# Patient Record
Sex: Male | Born: 1955 | Race: White | Hispanic: No | Marital: Married | Smoking: Never smoker
Health system: Southern US, Community
[De-identification: ages and names within clinical notes are randomized; demographics above are authoritative.]

## PROBLEM LIST (undated history)

## (undated) DIAGNOSIS — I8500 Esophageal varices without bleeding: Secondary | ICD-10-CM

## (undated) DIAGNOSIS — R161 Splenomegaly, not elsewhere classified: Secondary | ICD-10-CM

## (undated) DIAGNOSIS — N200 Calculus of kidney: Secondary | ICD-10-CM

## (undated) DIAGNOSIS — K7581 Nonalcoholic steatohepatitis (NASH): Secondary | ICD-10-CM

## (undated) HISTORY — PX: ELBOW SURGERY: SHX618

---

## 2003-05-05 ENCOUNTER — Other Ambulatory Visit: Payer: Self-pay

## 2004-01-03 ENCOUNTER — Ambulatory Visit: Payer: Self-pay | Admitting: Unknown Physician Specialty

## 2011-09-05 DIAGNOSIS — E1165 Type 2 diabetes mellitus with hyperglycemia: Secondary | ICD-10-CM | POA: Insufficient documentation

## 2011-09-05 DIAGNOSIS — R809 Proteinuria, unspecified: Secondary | ICD-10-CM | POA: Insufficient documentation

## 2011-10-11 DIAGNOSIS — L84 Corns and callosities: Secondary | ICD-10-CM | POA: Insufficient documentation

## 2011-10-31 DIAGNOSIS — E669 Obesity, unspecified: Secondary | ICD-10-CM | POA: Insufficient documentation

## 2011-11-05 DIAGNOSIS — K259 Gastric ulcer, unspecified as acute or chronic, without hemorrhage or perforation: Secondary | ICD-10-CM | POA: Insufficient documentation

## 2011-11-05 DIAGNOSIS — D696 Thrombocytopenia, unspecified: Secondary | ICD-10-CM | POA: Insufficient documentation

## 2011-11-05 DIAGNOSIS — K449 Diaphragmatic hernia without obstruction or gangrene: Secondary | ICD-10-CM | POA: Insufficient documentation

## 2011-11-05 DIAGNOSIS — K3189 Other diseases of stomach and duodenum: Secondary | ICD-10-CM | POA: Insufficient documentation

## 2011-11-05 DIAGNOSIS — M5126 Other intervertebral disc displacement, lumbar region: Secondary | ICD-10-CM | POA: Insufficient documentation

## 2011-11-27 DIAGNOSIS — N529 Male erectile dysfunction, unspecified: Secondary | ICD-10-CM | POA: Insufficient documentation

## 2012-05-19 DIAGNOSIS — K644 Residual hemorrhoidal skin tags: Secondary | ICD-10-CM | POA: Insufficient documentation

## 2012-06-11 DIAGNOSIS — K7460 Unspecified cirrhosis of liver: Secondary | ICD-10-CM | POA: Insufficient documentation

## 2013-09-11 DIAGNOSIS — E1159 Type 2 diabetes mellitus with other circulatory complications: Secondary | ICD-10-CM | POA: Insufficient documentation

## 2013-09-11 DIAGNOSIS — I152 Hypertension secondary to endocrine disorders: Secondary | ICD-10-CM | POA: Insufficient documentation

## 2014-06-30 DIAGNOSIS — M7501 Adhesive capsulitis of right shoulder: Secondary | ICD-10-CM | POA: Insufficient documentation

## 2014-11-16 DIAGNOSIS — M7918 Myalgia, other site: Secondary | ICD-10-CM | POA: Insufficient documentation

## 2015-04-22 DIAGNOSIS — M720 Palmar fascial fibromatosis [Dupuytren]: Secondary | ICD-10-CM | POA: Insufficient documentation

## 2015-06-16 DIAGNOSIS — G4700 Insomnia, unspecified: Secondary | ICD-10-CM | POA: Insufficient documentation

## 2015-08-19 DIAGNOSIS — M19011 Primary osteoarthritis, right shoulder: Secondary | ICD-10-CM | POA: Insufficient documentation

## 2016-03-22 DIAGNOSIS — G8929 Other chronic pain: Secondary | ICD-10-CM | POA: Insufficient documentation

## 2017-11-11 DIAGNOSIS — R161 Splenomegaly, not elsewhere classified: Secondary | ICD-10-CM | POA: Insufficient documentation

## 2020-05-24 DIAGNOSIS — M7712 Lateral epicondylitis, left elbow: Secondary | ICD-10-CM | POA: Insufficient documentation

## 2020-06-05 DIAGNOSIS — K7460 Unspecified cirrhosis of liver: Secondary | ICD-10-CM | POA: Diagnosis present

## 2020-06-05 DIAGNOSIS — K921 Melena: Secondary | ICD-10-CM | POA: Insufficient documentation

## 2020-07-26 ENCOUNTER — Encounter: Payer: Self-pay | Admitting: Emergency Medicine

## 2020-07-26 ENCOUNTER — Emergency Department
Admission: EM | Admit: 2020-07-26 | Discharge: 2020-07-27 | Disposition: A | Discharge status: Home / Self Care | Payer: 59 | Attending: Emergency Medicine | Admitting: Emergency Medicine

## 2020-07-26 ENCOUNTER — Other Ambulatory Visit: Payer: Self-pay

## 2020-07-26 DIAGNOSIS — N202 Calculus of kidney with calculus of ureter: Secondary | ICD-10-CM | POA: Insufficient documentation

## 2020-07-26 DIAGNOSIS — R109 Unspecified abdominal pain: Secondary | ICD-10-CM | POA: Diagnosis present

## 2020-07-26 DIAGNOSIS — N200 Calculus of kidney: Secondary | ICD-10-CM

## 2020-07-26 DIAGNOSIS — N201 Calculus of ureter: Secondary | ICD-10-CM

## 2020-07-26 HISTORY — DX: Esophageal varices without bleeding: I85.00

## 2020-07-26 HISTORY — DX: Nonalcoholic steatohepatitis (NASH): K75.81

## 2020-07-26 HISTORY — DX: Calculus of kidney: N20.0

## 2020-07-26 HISTORY — DX: Splenomegaly, not elsewhere classified: R16.1

## 2020-07-26 NOTE — ED Triage Notes (Signed)
Patient ambulatory to triage with steady gait, without difficulty or distress noted; pt reports left flank pain radiating into left lower abd acocmp by nausea since 8pm; st hx enlarged spleen and kidney stone

## 2020-07-27 ENCOUNTER — Encounter: Payer: Self-pay | Admitting: Emergency Medicine

## 2020-07-27 ENCOUNTER — Emergency Department: Payer: 59

## 2020-07-27 LAB — URINALYSIS, ROUTINE W REFLEX MICROSCOPIC
Bacteria, UA: NONE SEEN
Bilirubin Urine: NEGATIVE
Glucose, UA: >=500 mg/dL — AB
Ketones, ur: NEGATIVE mg/dL
Leukocytes,Ua: NEGATIVE
Nitrite: NEGATIVE
Protein, ur: NEGATIVE mg/dL
Specific Gravity, Urine: 1.029 (ref 1.005–1.030)
pH: 6 (ref 5.0–8.0)

## 2020-07-27 LAB — CBC WITH DIFFERENTIAL/PLATELET
Abs Immature Granulocytes: 0.01 10*3/uL (ref 0.00–0.07)
Basophils Absolute: 0 10*3/uL (ref 0.0–0.1)
Basophils Relative: 1 %
Eosinophils Absolute: 0.1 10*3/uL (ref 0.0–0.5)
Eosinophils Relative: 3 %
HCT: 33.2 % — ABNORMAL LOW (ref 39.0–52.0)
Hemoglobin: 10.3 g/dL — ABNORMAL LOW (ref 13.0–17.0)
Immature Granulocytes: 1 %
Lymphocytes Relative: 19 %
Lymphs Abs: 0.4 10*3/uL — ABNORMAL LOW (ref 0.7–4.0)
MCH: 25.5 pg — ABNORMAL LOW (ref 26.0–34.0)
MCHC: 31 g/dL (ref 30.0–36.0)
MCV: 82.2 fL (ref 80.0–100.0)
Monocytes Absolute: 0.3 10*3/uL (ref 0.1–1.0)
Monocytes Relative: 14 %
Neutro Abs: 1.2 10*3/uL — ABNORMAL LOW (ref 1.7–7.7)
Neutrophils Relative %: 62 %
Platelets: 34 10*3/uL — ABNORMAL LOW (ref 150–400)
RBC: 4.04 MIL/uL — ABNORMAL LOW (ref 4.22–5.81)
RDW: 16 % — ABNORMAL HIGH (ref 11.5–15.5)
WBC: 1.9 10*3/uL — ABNORMAL LOW (ref 4.0–10.5)
nRBC: 0 % (ref 0.0–0.2)

## 2020-07-27 LAB — COMPREHENSIVE METABOLIC PANEL
ALT: 30 U/L (ref 0–44)
AST: 42 U/L — ABNORMAL HIGH (ref 15–41)
Albumin: 3.8 g/dL (ref 3.5–5.0)
Alkaline Phosphatase: 75 U/L (ref 38–126)
Anion gap: 7 (ref 5–15)
BUN: 18 mg/dL (ref 8–23)
CO2: 21 mmol/L — ABNORMAL LOW (ref 22–32)
Calcium: 8.8 mg/dL — ABNORMAL LOW (ref 8.9–10.3)
Chloride: 107 mmol/L (ref 98–111)
Creatinine, Ser: 1.42 mg/dL — ABNORMAL HIGH (ref 0.61–1.24)
GFR, Estimated: 56 mL/min — ABNORMAL LOW (ref >60)
Glucose, Bld: 324 mg/dL — ABNORMAL HIGH (ref 70–99)
Potassium: 4.1 mmol/L (ref 3.5–5.1)
Sodium: 135 mmol/L (ref 135–145)
Total Bilirubin: 0.9 mg/dL (ref 0.3–1.2)
Total Protein: 6.7 g/dL (ref 6.5–8.1)

## 2020-07-27 LAB — LIPASE, BLOOD: Lipase: 44 U/L (ref 11–51)

## 2020-07-27 MED ORDER — TAMSULOSIN HCL 0.4 MG PO CAPS: ORAL_CAPSULE | ORAL | 0 refills | Status: DC

## 2020-07-27 MED ORDER — ONDANSETRON HCL 4 MG PO TABS: 4.0000 mg | ORAL_TABLET | Freq: Every day | ORAL | 1 refills | Status: AC | PRN

## 2020-07-27 MED ORDER — OXYCODONE-ACETAMINOPHEN 5-325 MG PO TABS: 2.0000 | ORAL_TABLET | Freq: Four times a day (QID) | ORAL | 0 refills | Status: DC | PRN

## 2020-07-27 MED ORDER — ONDANSETRON HCL 4 MG/2ML IJ SOLN
4.0000 mg | INTRAMUSCULAR | Status: AC
  Administered 2020-07-27: 4 mg via INTRAVENOUS
  Filled 2020-07-27: qty 2

## 2020-07-27 MED ORDER — OXYCODONE-ACETAMINOPHEN 5-325 MG PO TABS
2.0000 | ORAL_TABLET | Freq: Once | ORAL | Status: AC
  Administered 2020-07-27: 2 via ORAL
  Filled 2020-07-27: qty 2

## 2020-07-27 MED ORDER — KETOROLAC TROMETHAMINE 30 MG/ML IJ SOLN
15.0000 mg | Freq: Once | INTRAMUSCULAR | Status: AC
  Administered 2020-07-27: 15 mg via INTRAVENOUS
  Filled 2020-07-27: qty 1

## 2020-07-27 MED ORDER — DOCUSATE SODIUM 100 MG PO CAPS: ORAL_CAPSULE | ORAL | 0 refills | Status: DC

## 2020-07-27 MED ORDER — LACTATED RINGERS IV BOLUS
1000.0000 mL | Freq: Once | INTRAVENOUS | Status: AC
  Administered 2020-07-27: 1000 mL via INTRAVENOUS

## 2020-07-27 MED ORDER — MORPHINE SULFATE (PF) 4 MG/ML IV SOLN
4.0000 mg | Freq: Once | INTRAVENOUS | Status: AC
  Administered 2020-07-27: 4 mg via INTRAVENOUS
  Filled 2020-07-27: qty 1

## 2020-07-27 NOTE — ED Notes (Signed)
ED Provider at bedside. 

## 2020-07-27 NOTE — ED Notes (Addendum)
Pt presents with the sudden onset of L flank pain that radiates around to his lower back and into his groin around 8pm. Pt reports associated nausea with no vomiting. Pt reports severe 10/10 pain. Hx of kidney stones but states "this feels different".

## 2020-07-27 NOTE — ED Notes (Signed)
Pt back to room from CT

## 2020-07-27 NOTE — Discharge Instructions (Signed)
You have been seen in the Emergency Department (ED) today for pain caused by kidney stones.  As we have discussed, please drink plenty of fluids.  Please make a follow up appointment with the physician(s) listed elsewhere in this documentation.  You may take pain medication as needed but ONLY as prescribed.  Please also take your prescribed Flomax daily.    Please see your doctor as soon as possible as stones may take 1-3 weeks to pass and you may require additional care or medications.  Do not drink alcohol, drive or participate in any other potentially dangerous activities while taking opiate pain medication as it may make you sleepy. Do not take this medication with any other sedating medications, either prescription or over-the-counter. If you were prescribed Percocet or Vicodin, do not take these with acetaminophen (Tylenol) as it is already contained within these medications.   Take Percocet as needed for severe pain.  This medication is an opiate (or narcotic) pain medication and can be habit forming.  Use it as little as possible to achieve adequate pain control.  Do not use or use it with extreme caution if you have a history of opiate abuse or dependence.  If you are on a pain contract with your primary care doctor or a pain specialist, be sure to let them know you were prescribed this medication today from the [facility] Emergency Department.  This medication is intended for your use only - do not give any to anyone else and keep it in a secure place where nobody else, especially children, have access to it.  It will also cause or worsen constipation, so you may want to consider taking an over-the-counter stool softener while you are taking this medication.  Return to the Emergency Department (ED) or call your doctor if you have any worsening pain, fever, painful urination, are unable to urinate, or develop other symptoms that concern you.

## 2020-07-27 NOTE — ED Provider Notes (Signed)
[facility] Emergency Department Provider Note  ____________________________________________   Event Date/Time   First MD Initiated Contact with Patient 07/26/20 2358     (approximate)  I have reviewed the triage vital signs and the nursing notes.   HISTORY  Chief Complaint Flank Pain    HPI Eugene Cameron is a 64 y.o. male with medical history as listed below who presents for evaluation of cute onset and severe sharp pain in his left flank that radiates to the left side of his abdomen.  This occurred about 4 hours ago.  It has waxed and waned and almost went away but then came back even more severe just recently.  He has had nausea and several dry heaves but no significant vomiting.  Nothing particular makes his symptoms better or worse.  He denies fever, sore throat, chest pain, shortness of breath.  He was feeling fine before the acute onset.  He has had no hematuria nor dysuria.         Past Medical History:  Diagnosis Date  . Esophageal varices (HCC)    s/p multiple bandings  . Kidney stone   . NASH (nonalcoholic steatohepatitis)   . Spleen enlarged     Patient Active Problem List   Diagnosis Date Noted  . Kidney stone     Past Surgical History:  Procedure Laterality Date  . ELBOW SURGERY Left     Prior to Admission medications   Medication Sig Start Date End Date Taking? Authorizing Provider  docusate sodium (COLACE) 100 MG capsule Take 1 tablet once or twice daily as needed for constipation while taking narcotic pain medicine 07/27/20  Yes Loleta Rose, MD  ondansetron (ZOFRAN) 4 MG tablet Take 1 tablet (4 mg total) by mouth daily as needed for nausea or vomiting. 07/27/20 07/27/21 Yes Loleta Rose, MD  oxyCODONE-acetaminophen (PERCOCET) 5-325 MG tablet Take 2 tablets by mouth every 6 (six) hours as needed for severe pain. 07/27/20  Yes Loleta Rose, MD  tamsulosin (FLOMAX) 0.4 MG CAPS capsule Take 1 tablet by mouth daily until you  pass the kidney stone or no longer have symptoms 07/27/20  Yes Loleta Rose, MD    Allergies Patient has no known allergies.  History reviewed. No pertinent family history.  Social History Social History   Tobacco Use  . Smoking status: Never Smoker  . Smokeless tobacco: Never Used    Review of Systems Constitutional: No fever/chills Eyes: No visual changes. ENT: No sore throat. Cardiovascular: Denies chest pain. Respiratory: Denies shortness of breath. Gastrointestinal: Positive for left-sided flank pain that radiates into the left side of his abdomen associated with nausea and some degree of vomiting. Genitourinary: Negative for dysuria. Musculoskeletal: Left flank pain, otherwise negative for back pain or musculoskeletal issues. Integumentary: Negative for rash. Neurological: Negative for headaches, focal weakness or numbness.   ____________________________________________   PHYSICAL EXAM:  VITAL SIGNS: ED Triage Vitals  Enc Vitals Group     BP 07/26/20 2348 (!) 152/81     Pulse Rate 07/26/20 2348 61     Resp 07/26/20 2348 16     Temp 07/26/20 2348 97.9 F (36.6 C)     Temp Source 07/26/20 2348 Oral     SpO2 07/26/20 2348 100 %     Weight 07/26/20 2343 104.3 kg (230 lb)     Height 07/26/20 2343 1.829 m (6')     Head Circumference --      Peak Flow --  Pain Score 07/26/20 2343 9     Pain Loc --      Pain Edu? --      Excl. in GC? --     Constitutional: Alert and oriented.  Patient is in pain but conversant. Eyes: Conjunctivae are normal.  Head: Atraumatic. Nose: No congestion/rhinnorhea. Mouth/Throat: Patient is wearing a mask. Neck: No stridor.  No meningeal signs.   Cardiovascular: Normal rate, regular rhythm. Good peripheral circulation. Respiratory: Normal respiratory effort.  No retractions. Gastrointestinal: Soft and nondistended.  Some guarding and mild tenderness to palpation of the left side of his abdomen. Musculoskeletal: Tenderness to  percussion of the left flank. Neurologic:  Normal speech and language. No gross focal neurologic deficits are appreciated.  Skin:  Skin is warm, dry and intact. Psychiatric: Mood and affect are normal. Speech and behavior are normal.  ____________________________________________   LABS (all labs ordered are listed, but only abnormal results are displayed)  Labs Reviewed  CBC WITH DIFFERENTIAL/PLATELET - Abnormal; Notable for the following components:      Result Value   WBC 1.9 (*)    RBC 4.04 (*)    Hemoglobin 10.3 (*)    HCT 33.2 (*)    MCH 25.5 (*)    RDW 16.0 (*)    Platelets 34 (*)    Neutro Abs 1.2 (*)    Lymphs Abs 0.4 (*)    All other components within normal limits  COMPREHENSIVE METABOLIC PANEL - Abnormal; Notable for the following components:   CO2 21 (*)    Glucose, Bld 324 (*)    Creatinine, Ser 1.42 (*)    Calcium 8.8 (*)    AST 42 (*)    GFR, Estimated 56 (*)    All other components within normal limits  URINALYSIS, ROUTINE W REFLEX MICROSCOPIC - Abnormal; Notable for the following components:   Color, Urine YELLOW (*)    APPearance CLEAR (*)    Glucose, UA >=500 (*)    Hgb urine dipstick MODERATE (*)    All other components within normal limits  LIPASE, BLOOD   ____________________________________________  EKG  No indication for emergent EKG ____________________________________________  RADIOLOGY I, Loleta Roseory Dontea Corlew, personally viewed and evaluated these images (plain radiographs) as part of my medical decision making, as well as reviewing the written report by the radiologist.  ED MD interpretation: 3 x 3 x 5 mm left UPJ stone with some hydronephrosis.  There is also some peripancreatic stranding but it does not correlate clinically with any acute tenderness.  Official radiology report(s): CT Renal Stone Study  Result Date: 07/27/2020 CLINICAL DATA:  Left flank pain, lower abdominal pain, nausea EXAM: CT ABDOMEN AND PELVIS WITHOUT CONTRAST TECHNIQUE:  Multidetector CT imaging of the abdomen and pelvis was performed following the standard protocol without IV contrast. COMPARISON:  None. FINDINGS: Lower chest: Mild bibasilar atelectasis. Hypoattenuation of the cardiac blood pool is in keeping with at least mild anemia. Small hiatal hernia. Gastroesophageal varices noted. Hepatobiliary: The liver is atrophic and demonstrates a nodular contour in keeping with changes of advanced cirrhosis. There is no definite focal intrahepatic mass identified on this noncontrast examination. No intra or extrahepatic biliary ductal dilation. Cholecystectomy has been performed. Pancreas: There is mild peripancreatic soft tissue infiltration which may reflect retroperitoneal edema given the extensive retroperitoneal portosystemic collateralization or superimposed acute inflammatory changes in the setting of acute pancreatitis. The pancreatic duct is not dilated. No pancreatic parenchymal calcifications are seen. No peripancreatic fluid collections are identified. Spleen: The spleen is  markedly enlarged measuring 22 cm in greatest dimension. No intrasplenic lesion is identified on this noncontrast examination. Adrenals/Urinary Tract: The adrenal glands are unremarkable. The kidneys are normal in size and position. There is mild left hydronephrosis secondary to an obstructing 3 mm x 3 mm x 5 mm calculus within the a left ureteropelvic junction. Nonobstructing 4 mm calculus is seen within the lower pole of the right kidney. No additional ureteral calculi. No hydronephrosis on the right. The bladder is unremarkable. Stomach/Bowel: Stomach, small bowel, and large bowel are unremarkable. Appendix normal. Mild perihepatic ascites. No free intraperitoneal gas. Vascular/Lymphatic: Numerous gastroesophageal varices are identified and there is enlargement of the a splenic, superior mesenteric, and portal veins in keeping with portal venous hypertension. The abdominal vasculature is otherwise  unremarkable on this noncontrast examination. Reproductive: Mild central prostatic enlargement. Seminal vesicles are unremarkable. Other: No abdominal wall hernia.  Rectum unremarkable. Musculoskeletal: No acute bone abnormality. No lytic or blastic bone lesion identified. IMPRESSION: Obstructing 3 x 3 x 5 mm calculus within the left ureteropelvic junction resulting in mild left hydronephrosis. Mild nonobstructing right nephrolithiasis. Changes of advanced cirrhosis and portal venous hypertension with engorgement of the portal venous system, marked splenomegaly, and extensive gastroesophageal varices. Peripancreatic edema possibly related to retroperitoneal portosystemic collateralization or superimposed acute inflammatory changes as can be seen with acute pancreatitis. Correlation with laboratory examination may be helpful for further evaluation. Electronically Signed   By: Helyn Numbers MD   On: 07/27/2020 02:13    ____________________________________________   PROCEDURES   Procedure(s) performed (including Critical Care):  Procedures   ____________________________________________   INITIAL IMPRESSION / MDM / ASSESSMENT AND PLAN / ED COURSE  As part of my medical decision making, I reviewed the following data within the electronic MEDICAL RECORD NUMBER History obtained from family, Nursing notes reviewed and incorporated, Labs reviewed , Old chart reviewed, Notes from prior ED visits and [place] Controlled Substance Database   Differential diagnosis includes, but is not limited to, renal/ureteral colic, diverticulitis, musculoskeletal strain, UTI/pyelonephritis.  Patient is hemodynamically stable.  Plan is for CT renal stone protocol and standard lab work including urinalysis.  Ordering morphine 4 mg IV, Toradol 15 mg IV, and Zofran 4 mg IV.  Patient and his wife understand and agree with the plan.     Clinical Course as of 07/27/20 0255  Wed Jul 27, 2020  0106 CBC with  Differential/Platelet(!) Patient with pancytopenia.  However I reviewed the medical record from [facility] and his CBC is stable compared to before, with an even slightly greater hemoglobin.  However his creatinine is a little bit elevated from baseline which seems to be around 1.1 or 1.2.  I ordered lactated Ringer's 1 L IV bolus. [CF]  0148 Urinalysis, Routine w reflex microscopic Urine, Clean Catch(!) Hematuria without evidence of infection [CF]  0251 Patient has 3 x 3 x 5 mm left UPJ stone.  I reassessed him and he said he has no pain at all at this time.  He will follow-up as needed with urology but I explained that I think it is unlikely he will be a candidate for lithotripsy given his chronic thrombocytopenia.  He was pleased at his lab results as they are better than I have been in the past and he has doctors with whom he can follow-up.  He said that he has been told that is okay if he uses Tylenol if needed so I am writing a prescription for Percocet, Flomax, and Zofran.  I gave my  usual and customary management recommendations and return precautions. [CF]    Clinical Course User Index [CF] Loleta Rose, MD     ____________________________________________  FINAL CLINICAL IMPRESSION(S) / ED DIAGNOSES  Final diagnoses:  Left ureteral stone  Kidney stone     MEDICATIONS GIVEN DURING THIS VISIT:  Medications  oxyCODONE-acetaminophen (PERCOCET/ROXICET) 5-325 MG per tablet 2 tablet (has no administration in time range)  morphine 4 MG/ML injection 4 mg (4 mg Intravenous Given 07/27/20 0041)  ondansetron (ZOFRAN) injection 4 mg (4 mg Intravenous Given 07/27/20 0040)  ketorolac (TORADOL) 30 MG/ML injection 15 mg (15 mg Intravenous Given 07/27/20 0040)  lactated ringers bolus 1,000 mL (1,000 mLs Intravenous New Bag/Given 07/27/20 0117)     ED Discharge Orders         Ordered    oxyCODONE-acetaminophen (PERCOCET) 5-325 MG tablet  Every 6 hours PRN        07/27/20 0254    ondansetron (ZOFRAN)  4 MG tablet  Daily PRN        07/27/20 0254    tamsulosin (FLOMAX) 0.4 MG CAPS capsule        07/27/20 0254    docusate sodium (COLACE) 100 MG capsule        07/27/20 0254          *Please note:  Eugene Cameron was evaluated in Emergency Department on 07/27/2020 for the symptoms described in the history of present illness. He was evaluated in the context of the global COVID-19 pandemic, which necessitated consideration that the patient might be at risk for infection with the SARS-CoV-2 virus that causes COVID-19. Institutional protocols and algorithms that pertain to the evaluation of patients at risk for COVID-19 are in a state of rapid change based on information released by regulatory bodies including the CDC and federal and state organizations. These policies and algorithms were followed during the patient's care in the ED.  Some ED evaluations and interventions may be delayed as a result of limited staffing during and after the pandemic.*  Note:  This document was prepared using Dragon voice recognition software and may include unintentional dictation errors.   Loleta Rose, MD 07/27/20 [phone]

## 2020-08-02 ENCOUNTER — Other Ambulatory Visit
Admission: RE | Admit: 2020-08-02 | Discharge: 2020-08-02 | Disposition: A | Discharge status: Home / Self Care | Payer: 59 | Source: Home / Self Care | Attending: Urology | Admitting: Urology

## 2020-08-02 ENCOUNTER — Ambulatory Visit
Admission: RE | Admit: 2020-08-02 | Discharge: 2020-08-02 | Disposition: A | Discharge status: Home / Self Care | Payer: 59 | Attending: Urology | Admitting: Urology

## 2020-08-02 ENCOUNTER — Other Ambulatory Visit: Payer: Self-pay | Admitting: *Deleted

## 2020-08-02 ENCOUNTER — Ambulatory Visit (INDEPENDENT_AMBULATORY_CARE_PROVIDER_SITE_OTHER): Payer: 59 | Admitting: Urology

## 2020-08-02 ENCOUNTER — Ambulatory Visit
Admission: RE | Admit: 2020-08-02 | Discharge: 2020-08-02 | Disposition: A | Discharge status: Home / Self Care | Payer: 59 | Source: Ambulatory Visit | Attending: Urology | Admitting: Urology

## 2020-08-02 ENCOUNTER — Encounter: Payer: Self-pay | Admitting: Urology

## 2020-08-02 ENCOUNTER — Other Ambulatory Visit: Payer: Self-pay

## 2020-08-02 VITALS — BP 114/68 | HR 65 | Ht 72.0 in | Wt 227.0 lb

## 2020-08-02 DIAGNOSIS — R1012 Left upper quadrant pain: Secondary | ICD-10-CM

## 2020-08-02 DIAGNOSIS — N200 Calculus of kidney: Secondary | ICD-10-CM

## 2020-08-02 DIAGNOSIS — N201 Calculus of ureter: Secondary | ICD-10-CM | POA: Diagnosis not present

## 2020-08-02 LAB — URINALYSIS, COMPLETE (UACMP) WITH MICROSCOPIC
Bacteria, UA: NONE SEEN
Bilirubin Urine: NEGATIVE
Glucose, UA: 500 mg/dL — AB
Hgb urine dipstick: NEGATIVE
Ketones, ur: NEGATIVE mg/dL
Leukocytes,Ua: NEGATIVE
Nitrite: NEGATIVE
Protein, ur: NEGATIVE mg/dL
Specific Gravity, Urine: 1.01 (ref 1.005–1.030)
Squamous Epithelial / HPF: NONE SEEN (ref 0–5)
pH: 5.5 (ref 5.0–8.0)

## 2020-08-02 NOTE — Progress Notes (Signed)
   08/02/20 10:54 AM   Tereasa Coop November 25, 1955 [id number]  CC: Left UPJ stone  HPI: I saw Mr. Eugene Cameron and his wife in clinic for the above issues.  He is a 65 year old male who presented to the ED on 07/27/2020 with severe left-sided flank pain.  CT showed a 5 mm left UPJ stone with mild hydronephrosis, and a 5 mm right lower pole nonobstructing stone.  He was discharged with medical expulsive therapy.  He reports has been feeling pretty well over the last few days without any flank pain.  Urinalysis today is completely benign.  On KUB today, I do not appreciate any obvious stone on the left side, and right lower pole stone is clearly seen.   PMH: Past Medical History:  Diagnosis Date  . Esophageal varices (HCC)    s/p multiple bandings  . Kidney stone   . NASH (nonalcoholic steatohepatitis)   . Spleen enlarged     Surgical History: Past Surgical History:  Procedure Laterality Date  . ELBOW SURGERY Left     Social History:  reports that he has never smoked. He has never used smokeless tobacco. No history on file for alcohol use and drug use.  Physical Exam: BP 114/68   Pulse 65   Ht 6' (1.829 m)   Wt 227 lb (103 kg)   BMI 30.79 kg/m    Constitutional:  Alert and oriented, No acute distress. Cardiovascular: No clubbing, cyanosis, or edema. Respiratory: Normal respiratory effort, no increased work of breathing. GI: Abdomen is soft, nontender, nondistended, no abdominal masses  Laboratory Data: Reviewed, see HPI  Pertinent Imaging: I have personally viewed and interpreted the CT from 07/27/2020 showing a 5 mm left UPJ stone, and KUB today with no obvious calcification  Assessment & Plan:   65 year old male with a 5 mm left UPJ stone diagnosed on CT on 07/27/2020.  He has had complete resolution of his pain over the last few days, and urinalysis today is benign.  On his KUB today, I do not appreciate any obvious calcification.  He does a lot of traveling, and is very  worried that the stone could still be present even though his pain is better, and with his upcoming traveling he would like to get confirmation with a CT.  We discussed the risks and benefits including cost, radiation, and he would like to pursue CT today.  -CT today to confirm stone passage, call with results -Continue yearly follow-up for right lower pole stone surveillance, consider shockwave lithotripsy in the future after this acute stone episode if patient desires  Legrand Rams, MD 08/02/2020  [facility] [address], [address] [place], [place] [address] [phone]

## 2020-08-02 NOTE — Patient Instructions (Addendum)
Textbook of Natural Medicine (5th ed., pp. 1518-1527.e3). [place], [place]: Elsevier.">  Dietary Guidelines to Help Prevent Kidney Stones Kidney stones are deposits of minerals and salts that form inside your kidneys. Your risk of developing kidney stones may be greater depending on your diet, your lifestyle, the medicines you take, and whether you have certain medical conditions. Most people can lower their chances of developing kidney stones by following the instructions below. Your dietitian may give you more specific instructions depending on your overall health and the type of kidney stones you tend to develop. What are tips for following this plan? Reading food labels  Choose foods with "no salt added" or "low-salt" labels. Limit your salt (sodium) intake to less than 1,500 mg a day.  Choose foods with calcium for each meal and snack. Try to eat about 300 mg of calcium at each meal. Foods that contain 200-500 mg of calcium a serving include: ? 8 oz (237 mL) of milk, calcium-fortifiednon-dairy milk, and calcium-fortifiedfruit juice. Calcium-fortified means that calcium has been added to these drinks. ? 8 oz (237 mL) of kefir, yogurt, and soy yogurt. ? 4 oz (114 g) of tofu. ? 1 oz (28 g) of cheese. ? 1 cup (150 g) of dried figs. ? 1 cup (91 g) of cooked broccoli. ? One 3 oz (85 g) can of sardines or mackerel. Most people need 1,000-1,500 mg of calcium a day. Talk to your dietitian about how much calcium is recommended for you.   Shopping  Buy plenty of fresh fruits and vegetables. Most people do not need to avoid fruits and vegetables, even if these foods contain nutrients that may contribute to kidney stones.  When shopping for convenience foods, choose: ? Whole pieces of fruit. ? Pre-made salads with dressing on the side. ? Low-fat fruit and yogurt smoothies.  Avoid buying frozen meals or prepared deli foods. These can be high in sodium.  Look for foods with live cultures, such as  yogurt and kefir.  Choose high-fiber grains, such as whole-wheat breads, oat bran, and wheat cereals. Cooking  Do not add salt to food when cooking. Place a salt shaker on the table and allow each person to add his or her own salt to taste.  Use vegetable protein, such as beans, textured vegetable protein (TVP), or tofu, instead of meat in pasta, casseroles, and soups. Meal planning  Eat less salt, if told by your dietitian. To do this: ? Avoid eating processed or pre-made food. ? Avoid eating fast food.  Eat less animal protein, including cheese, meat, poultry, or fish, if told by your dietitian. To do this: ? Limit the number of times you have meat, poultry, fish, or cheese each week. Eat a diet free of meat at least 2 days a week. ? Eat only one serving each day of meat, poultry, fish, or seafood. ? When you prepare animal protein, cut pieces into small portion sizes. For most meat and fish, one serving is about the size of the palm of your hand.  Eat at least five servings of fresh fruits and vegetables each day. To do this: ? Keep fruits and vegetables on hand for snacks. ? Eat one piece of fruit or a handful of berries with breakfast. ? Have a salad and fruit at lunch. ? Have two kinds of vegetables at dinner.  Limit foods that are high in a substance called oxalate. These include: ? Spinach (cooked), rhubarb, beets, sweet potatoes, and Swiss chard. ? Peanuts. ?   Potato chips, french fries, and baked potatoes with skin on. ? Nuts and nut products. ? Chocolate.  If you regularly take a diuretic medicine, make sure to eat at least 1 or 2 servings of fruits or vegetables that are high in potassium each day. These include: ? Avocado. ? Banana. ? Orange, prune, carrot, or tomato juice. ? Baked potato. ? Cabbage. ? Beans and split peas. Lifestyle  Drink enough fluid to keep your urine pale yellow. This is the most important thing you can do. Spread your fluid intake throughout  the day.  If you drink alcohol: ? Limit how much you use to:  0-1 drink a day for women who are not pregnant.  0-2 drinks a day for men. ? Be aware of how much alcohol is in your drink. In the [place] one drink equals one 12 oz bottle of beer (355 mL), one 5 oz glass of wine (148 mL), or one 1 oz glass of hard liquor (44 mL).  Lose weight if told by your health care provider. Work with your dietitian to find an eating plan and weight loss strategies that work best for you.   General information  Talk to your health care provider and dietitian about taking daily supplements. You may be told the following depending on your health and the cause of your kidney stones: ? Not to take supplements with vitamin C. ? To take a calcium supplement. ? To take a daily probiotic supplement. ? To take other supplements such as magnesium, fish oil, or vitamin B6.  Take over-the-counter and prescription medicines only as told by your health care provider. These include supplements. What foods should I limit? Limit your intake of the following foods, or eat them as told by your dietitian. Vegetables Spinach. Rhubarb. Beets. Canned vegetables. Rosita Fire. Olives. Baked potatoes with skin. Grains Wheat bran. Baked goods. Salted crackers. Cereals high in sugar. Meats and other proteins Nuts. Nut butters. Large portions of meat, poultry, or fish. Salted, precooked, or cured meats, such as sausages, meat loaves, and hot dogs. Dairy Cheese. Beverages Regular soft drinks. Regular vegetable juice. Seasonings and condiments Seasoning blends with salt. Salad dressings. Soy sauce. Ketchup. Barbecue sauce. Other foods Canned soups. Canned pasta sauce. Casseroles. Pizza. Lasagna. Frozen meals. Potato chips. [place] fries. The items listed above may not be a complete list of foods and beverages you should limit. Contact a dietitian for more information. What foods should I avoid? Talk to your dietitian about  specific foods you should avoid based on the type of kidney stones you have and your overall health. Fruits Grapefruit. The item listed above may not be a complete list of foods and beverages you should avoid. Contact a dietitian for more information. Summary  Kidney stones are deposits of minerals and salts that form inside your kidneys.  You can lower your risk of kidney stones by making changes to your diet.  The most important thing you can do is drink enough fluid. Drink enough fluid to keep your urine pale yellow.  Talk to your dietitian about how much calcium you should have each day, and eat less salt and animal protein as told by your dietitian. This information is not intended to replace advice given to you by your health care provider. Make sure you discuss any questions you have with your health care provider. Document Revised: 03/12/2019 Document Reviewed: 03/12/2019 Elsevier Patient Education  2021 [employer] Inc.   Laser Therapy for Kidney Stones, Care After This sheet gives  you information about how to care for yourself after your procedure. Your health care provider may also give you more specific instructions. If you have problems or questions, contact your health care provider. What can I expect after the procedure? After the procedure, it is common to have:  Pain.  A burning sensation while urinating.  Small amounts of blood in your urine.  A need to urinate frequently.  Pieces of kidney stone in your urine.  Mild discomfort when urinating that may be felt in the back. You may experience this if you have a flexible tube (stent) in your ureter. Follow these instructions at home: Medicines  Take over-the-counter and prescription medicines only as told by your health care provider.  If you were prescribed an antibiotic medicine, take it as told by your health care provider. Do not stop taking the antibiotic even if you start to feel better.  Ask your health  care provider if the medicine prescribed to you: ? Requires you to avoid driving or using heavy machinery. ? Can cause constipation. You may need to take actions to prevent or treat constipation, such as:  Take over-the-counter or prescription medicines.  Eat foods that are high in fiber, such as beans, whole grains, and fresh fruits and vegetables.  Limit foods that are high in fat and processed sugars, such as fried or sweet foods. Activity  Return to your normal activities as told by your health care provider. Ask your health care provider what activities are safe for you.  Do not drive for 24 hours if you were given a sedative during your procedure. General instructions  If your health care provider approves, you may take a warm bath to ease discomfort and burning.  Drink enough fluid to keep your urine pale yellow. Your health care provider may recommend drinking two 8 oz (237 mL) glasses of water per hour for a few hours after your procedure.  You may be asked to strain your urine to collect any stone fragments that you pass. These fragments may be tested.  Keep all follow-up visits as told by your health care provider. This is important. If you have a stent, you will need to return to your health care provider to have the stent removed.   Contact a health care provider if you:  Have pain or a burning feeling that lasts more than 2 days.  Feel nauseous.  Vomit more and more often.  Have difficulty urinating.  Have pain that gets worse or does not get better with medicine. Get help right away if:  You are unable to urinate, even if your bladder feels full.  You have: ? Bright red blood or blood clots in your urine. ? More blood in your urine. ? Severe pain or discomfort. ? A fever or shaking chills. ? Abdominal pain. ? Difficulty breathing. ? Swelling in your legs. Summary  After the procedure, it is common to have a burning sensation while urinating and small  amounts of blood in your urine.  Take over-the-counter and prescription medicines only as told by your health care provider.  Drink enough fluid to keep your urine pale yellow.  Keep all follow-up visits as told by your health care provider. This is important. This information is not intended to replace advice given to you by your health care provider. Make sure you discuss any questions you have with your health care provider. Document Revised: 11/28/2017 Document Reviewed: 11/28/2017 Elsevier Patient Education  2021 [employer].  [employer] [facility] (25th ed., pp. [phone]). [place]: [employer], [employer]. Retrieved from https://www.clinicalkey.com/#!/content/[email or web address]">  Lithotripsy  Lithotripsy is a treatment that can help break up kidney stones that are too large to pass on their own. This is a nonsurgical procedure that crushes a kidney stone with shock waves. These shock waves pass through your body and focus on the kidney stone. They cause the kidney stone to break up into smaller pieces while it is still in the urinary tract. The smaller pieces of stone can pass more easily out of your body in the urine. Tell a health care provider about:  Any allergies you have.  All medicines you are taking, including vitamins, herbs, eye drops, creams, and over-the-counter medicines.  Any problems you or family members have had with anesthetic medicines.  Any blood disorders you have.  Any surgeries you have had.  Any medical conditions you have.  Whether you are pregnant or may be pregnant. What are the risks? Generally, this is a safe procedure. However, problems may occur, including:  Infection.  Bleeding from the kidney.  Bruising of the kidney or skin.  Scarring of the kidney, which can lead to: ? Increased blood pressure. ? Poor kidney function. ? Return (recurrence) of kidney stones.  Damage to other structures or  organs, such as the liver, colon, spleen, or pancreas.  Blockage (obstruction) of the tube that carries urine from the kidney to the bladder (ureter).  Failure of the kidney stone to break into pieces (fragments). What happens before the procedure? Staying hydrated Follow instructions from your health care provider about hydration, which may include:  Up to 2 hours before the procedure - you may continue to drink clear liquids, such as water, clear fruit juice, black coffee, and plain tea. Eating and drinking restrictions Follow instructions from your health care provider about eating and drinking, which may include:  8 hours before the procedure - stop eating heavy meals or foods, such as meat, fried foods, or fatty foods.  6 hours before the procedure - stop eating light meals or foods, such as toast or cereal.  6 hours before the procedure - stop drinking milk or drinks that contain milk.  2 hours before the procedure - stop drinking clear liquids. Medicines Ask your health care provider about:  Changing or stopping your regular medicines. This is especially important if you are taking diabetes medicines or blood thinners.  Taking medicines such as aspirin and ibuprofen. These medicines can thin your blood. Do not take these medicines unless your health care provider tells you to take them.  Taking over-the-counter medicines, vitamins, herbs, and supplements. Tests You may have tests, such as:  Blood tests.  Urine tests.  Imaging tests, such as a CT scan. General instructions  Plan to have someone take you home from the hospital or clinic.  If you will be going home right after the procedure, plan to have someone with you for 24 hours.  Ask your health care provider what steps will be taken to help prevent infection. These may include washing skin with a germ-killing soap. What happens during the procedure?  An IV will be inserted into one of your veins.  You will be  given one or more of the following: ? A medicine to help you relax (sedative). ? A medicine to make you fall asleep (general anesthetic).  A water-filled cushion may be placed behind your kidney or on your abdomen. In some cases, you may be placed in a tub  of lukewarm water.  Your body will be positioned in a way that makes it easy to target the kidney stone.  An X-ray or ultrasound exam will be done to locate your stone.  Shock waves will be aimed at the stone. If you are awake, you may feel a tapping sensation as the shock waves pass through your body.  A flexible tube with holes in it (stent) may be placed in the ureter. This will help keep urine flowing from the kidney if the fragments of the stone have been blocking the ureter. The procedure may vary among health care providers and hospitals.   What happens after the procedure?  You may have an X-ray to see whether the procedure was able to break up the kidney stone and how much of the stone has passed. If large stone fragments remain after treatment, you may need to have a second procedure at a later time.  Your blood pressure, heart rate, breathing rate, and blood oxygen level will be monitored until you leave the hospital or clinic.  You may be given antibiotics or pain medicine as needed.  If a stent was placed in your ureter during surgery, it may stay in place for a few weeks.  You may need to strain your urine to collect pieces of the kidney stone for testing.  You will need to drink plenty of water.  If you were given a sedative during the procedure, it can affect you for several hours. Do not drive or operate machinery until your health care provider says that it is safe. Summary  Lithotripsy is a treatment that can help break up kidney stones that are too large to pass on their own.  Lithotripsy is a nonsurgical procedure that crushes a kidney stone with shock waves.  Generally, this is a safe procedure. However,  problems may occur, including damage to the kidney or other organs, infection, or obstruction of the tube that carries urine from the kidney to the bladder (ureter).  You may have a stent placed in your ureter to help drain your urine. This stent may stay in place for a few weeks.  After the procedure, you will need to drink plenty of water. You may be asked to strain your urine to collect pieces of the kidney stone for testing. This information is not intended to replace advice given to you by your health care provider. Make sure you discuss any questions you have with your health care provider. Document Revised: 12/31/2018 Document Reviewed: 12/31/2018 Elsevier Patient Education  2021 [employer].

## 2020-08-03 ENCOUNTER — Telehealth: Payer: Self-pay

## 2020-08-03 NOTE — Telephone Encounter (Signed)
-----   Message from Sondra Come, MD sent at 08/03/2020  8:16 AM EDT ----- Good news, left ureteral stone has passed and is gone.  He has the small right lower pole stone that is not causing blockage.  I would recommend follow-up in 6 to 12 months with a KUB to evaluate for any change in size, but he can follow-up sooner if he wants to consider shockwave lithotripsy of that stone  Legrand Rams, MD 08/03/2020

## 2020-08-03 NOTE — Telephone Encounter (Signed)
See my chart message

## 2021-03-17 ENCOUNTER — Other Ambulatory Visit: Payer: Self-pay | Admitting: *Deleted

## 2021-03-17 ENCOUNTER — Ambulatory Visit
Admission: RE | Admit: 2021-03-17 | Discharge: 2021-03-17 | Disposition: A | Discharge status: Home / Self Care | Payer: 59 | Source: Ambulatory Visit | Attending: Physician Assistant | Admitting: Physician Assistant

## 2021-03-17 ENCOUNTER — Ambulatory Visit (INDEPENDENT_AMBULATORY_CARE_PROVIDER_SITE_OTHER): Payer: 59 | Admitting: Physician Assistant

## 2021-03-17 ENCOUNTER — Ambulatory Visit
Admission: RE | Admit: 2021-03-17 | Discharge: 2021-03-17 | Disposition: A | Discharge status: Home / Self Care | Payer: 59 | Attending: Physician Assistant | Admitting: Physician Assistant

## 2021-03-17 ENCOUNTER — Other Ambulatory Visit: Payer: Self-pay

## 2021-03-17 ENCOUNTER — Encounter: Payer: Self-pay | Admitting: Physician Assistant

## 2021-03-17 VITALS — BP 125/74 | HR 65 | Ht 72.0 in | Wt 220.0 lb

## 2021-03-17 DIAGNOSIS — N200 Calculus of kidney: Secondary | ICD-10-CM | POA: Diagnosis not present

## 2021-03-17 DIAGNOSIS — N201 Calculus of ureter: Secondary | ICD-10-CM | POA: Diagnosis not present

## 2021-03-17 LAB — URINALYSIS, COMPLETE
Bilirubin, UA: NEGATIVE
Ketones, UA: NEGATIVE
Leukocytes,UA: NEGATIVE
Nitrite, UA: NEGATIVE
Protein,UA: NEGATIVE
Specific Gravity, UA: <1.005 — ABNORMAL LOW (ref 1.005–1.030)
Urobilinogen, Ur: 0.2 mg/dL (ref 0.2–1.0)
pH, UA: 5 (ref 5.0–7.5)

## 2021-03-17 LAB — MICROSCOPIC EXAMINATION
Bacteria, UA: NONE SEEN
RBC, Urine: >30 /hpf — ABNORMAL HIGH (ref 0–2)

## 2021-03-17 MED ORDER — OXYCODONE HCL 5 MG PO TABS: 5.0000 mg | ORAL_TABLET | Freq: Four times a day (QID) | ORAL | 0 refills | Status: DC | PRN

## 2021-03-17 MED ORDER — ONDANSETRON 4 MG PO TBDP: 4.0000 mg | ORAL_TABLET | Freq: Three times a day (TID) | ORAL | 1 refills | Status: AC | PRN

## 2021-03-17 MED ORDER — TAMSULOSIN HCL 0.4 MG PO CAPS: ORAL_CAPSULE | ORAL | 0 refills | Status: AC

## 2021-03-17 MED ORDER — TAMSULOSIN HCL 0.4 MG PO CAPS: ORAL_CAPSULE | ORAL | 0 refills | Status: DC

## 2021-03-17 NOTE — Patient Instructions (Addendum)
For the next 4 weeks, please do the following: -Take Flomax 0.4mg  daily as prescribed today -Stay well hydrated -Strain your urine to catch any stones that pass -Treat any pain with Tylenol or oxycodone as prescribed today. You may take these medications together, but please be mindful of your maximum recommended daily Tylenol dose with your history of fatty liver. -Treat any nausea with Zofran as prescribed today  I will plan to see you back in clinic in 4 weeks with another x-ray prior to see if you have passed a stone.  Please call our office immediately (we are open 8a-5p Monday-Friday) or go to the Emergency Department if you develop any of the following: -Fever -Chills -Nausea and/or vomiting uncontrollable with Zofran -Pain uncontrollable with oxycodone

## 2021-03-17 NOTE — Progress Notes (Signed)
03/17/2021 12:20 PM   Eugene Cameron 06-Jul-1955 [id number]  CC: Chief Complaint  Patient presents with   Nephrolithiasis   HPI: Eugene Cameron is a 65 y.o. male with PMH NASH and nephrolithiasis with a known 4 mm right lower pole stone who presents today for evaluation of possible acute stone episode.   Today he reports onset of right flank pain radiating to the RLQ yesterday evening.  Pain is consistent with his past stone episode.  He has had some nausea without vomiting.  KUB today with interval migration of his right lower pole stone to the proximal right ureter at the level of L4-5.  In-office UA today positive for 2+ glucose and 3+ blood; urine microscopy with >30 RBCs/HPF.  PMH: Past Medical History:  Diagnosis Date   Esophageal varices ([place])    s/p multiple bandings   Kidney stone    NASH (nonalcoholic steatohepatitis)    Spleen enlarged     Surgical History: Past Surgical History:  Procedure Laterality Date   ELBOW SURGERY Left     Home Medications:  Allergies as of 03/17/2021       Reactions   Gluten Meal    Other reaction(s): Other (See Comments) GI upset   Statins    Other reaction(s): Other (See Comments), Other (See Comments) Liver disease contraindication Liver disease contraindication        Medication List        Accurate as of March 17, 2021 12:20 PM. If you have any questions, ask your nurse or doctor.          Accu-Chek Guide test strip Generic drug: glucose blood in the morning, at noon, and at bedtime.   BD Pen Needle Nano 2nd Gen 32G X 4 MM Misc Generic drug: Insulin Pen Needle daily.   carvedilol 6.25 MG tablet Commonly known as: COREG Take 1/2 tablet in morning, 1 tablet in evening   docusate sodium 100 MG capsule Commonly known as: Colace Take 1 tablet once or twice daily as needed for constipation while taking narcotic pain medicine   Farxiga 5 MG Tabs tablet Generic drug: dapagliflozin propanediol Take  5 mg by mouth daily.   fluticasone 50 MCG/ACT nasal spray Commonly known as: FLONASE Place 2 sprays into the nose as needed.   furosemide 20 MG tablet Commonly known as: LASIX Take 20 mg by mouth daily.   gabapentin 100 MG capsule Commonly known as: NEURONTIN Take 300 mg by mouth at bedtime.   glipiZIDE 10 MG tablet Commonly known as: GLUCOTROL Take 10 mg by mouth 2 (two) times daily.   ondansetron 4 MG tablet Commonly known as: Zofran Take 1 tablet (4 mg total) by mouth daily as needed for nausea or vomiting.   pantoprazole 40 MG tablet Commonly known as: PROTONIX Take 1 tablet by mouth daily.   pioglitazone 30 MG tablet Commonly known as: ACTOS Take 30 mg by mouth daily.   RA Probiotic Digestive Care Caps Take 1 capsule by mouth daily.   tamsulosin 0.4 MG Caps capsule Commonly known as: FLOMAX Take 1 tablet by mouth daily until you pass the kidney stone or no longer have symptoms   Tresiba FlexTouch 100 UNIT/ML FlexTouch Pen Generic drug: insulin degludec Inject 50 Units into the skin at bedtime.        Allergies:  Allergies  Allergen Reactions   Gluten Meal     Other reaction(s): Other (See Comments) GI upset   Statins     Other reaction(s):  Other (See Comments), Other (See Comments) Liver disease contraindication Liver disease contraindication     Family History: No family history on file.  Social History:   reports that he has never smoked. He has never used smokeless tobacco. No history on file for alcohol use and drug use.  Physical Exam: BP 125/74    Pulse 65    Ht 6' (1.829 m)    Wt 220 lb (99.8 kg)    BMI 29.84 kg/m   Constitutional:  Alert and oriented, no acute distress, nontoxic appearing HEENT: [place], AT Cardiovascular: No clubbing, cyanosis, or edema Respiratory: Normal respiratory effort, no increased work of breathing Skin: No rashes, bruises or suspicious lesions Neurologic: Grossly intact, no focal deficits, moving all 4  extremities Psychiatric: Normal mood and affect  Laboratory Data: Results for orders placed or performed in visit on 03/17/21  Microscopic Examination   Urine  Result Value Ref Range   WBC, UA 0-5 0 - 5 /hpf   RBC >30 (H) 0 - 2 /hpf   Epithelial Cells (non renal) 0-10 0 - 10 /hpf   Bacteria, UA None seen None seen/Few  Urinalysis, Complete  Result Value Ref Range   Specific Gravity, UA <1.005 (L) 1.005 - 1.030   pH, UA 5.0 5.0 - 7.5   Color, UA Yellow Yellow   Appearance Ur Cloudy (A) Clear   Leukocytes,UA Negative Negative   Protein,UA Negative Negative/Trace   Glucose, UA 2+ (A) Negative   Ketones, UA Negative Negative   RBC, UA 3+ (A) Negative   Bilirubin, UA Negative Negative   Urobilinogen, Ur 0.2 0.2 - 1.0 mg/dL   Nitrite, UA Negative Negative   Microscopic Examination See below:    Pertinent Imaging: KUB, 03/17/2021: CLINICAL DATA:  Right-sided flank pain.   EXAM: ABDOMEN - 1 VIEW   COMPARISON:  Aug 02, 2020   FINDINGS: The bowel gas pattern is normal. A stable 3 mm soft tissue calcification is seen projecting over the lower pole of the right kidney. Radiopaque surgical clips are seen overlying the right upper quadrant.   IMPRESSION: 1. Stable 3 mm right renal calculus. 2. No evidence of bowel obstruction.     Electronically Signed   By: Virgina Norfolk M.D.   On: 03/18/2021 22:13  I personally reviewed the images referenced above and note interval migration of the 4 mm right renal stone to the proximal right ureter.  Assessment & Plan:   1. Right ureteral stone KUB with a 4 mm right ureteral stone and UA with microscopic hematuria consistent with acute stone episode.  Low concern for urinary infection today in the absence of pyuria or bacteriuria and with stable vitals.  We discussed various treatment options for his stone including trial of passage vs. ESWL vs. ureteroscopy with laser lithotripsy and stent. We discussed that with successful trials  of passage in the recent past and a small ureteral stone, odds are in his favor that he will be able to pass this stone spontaneously.  We specifically discussed that ESWL is a less invasive procedure that requires less anesthesia, however patients have to pass their residual stone fragments, which may take several weeks and be associated with continued renal colic. By comparison, ureteroscopy is a more invasive surgical procedure that requires more anesthesia, but URS does require placement of a ureteral stent, which will remain in place for approximately 3-10 days and can be associated with flank pain, bladder pain, dysuria, urgency, frequency, urinary leakage, and gross hematuria.  Based  on this conversation, he would like to proceed with trial of passage. Starting Flomax and prescribing oxycodone and Zofran for symptom control. Strainer provider with trial of passage instructions in AVS. We discussed that he may also take Tylenol in keeping with maximum daily dose guidance from GI with his history of NASH. - Urinalysis, Complete - Abdomen 1 view (KUB); Future - oxyCODONE (ROXICODONE) 5 MG immediate release tablet; Take 1-2 tablets (5-10 mg total) by mouth every 6 (six) hours as needed for severe pain.  Dispense: 12 tablet; Refill: 0 - ondansetron (ZOFRAN-ODT) 4 MG disintegrating tablet; Take 1 tablet (4 mg total) by mouth every 8 (eight) hours as needed for nausea or vomiting.  Dispense: 20 tablet; Refill: 1 - tamsulosin (FLOMAX) 0.4 MG CAPS capsule; Take 1 tablet by mouth daily until you pass the kidney stone or no longer have symptoms  Dispense: 30 capsule; Refill: 0  Return in about 4 weeks (around 04/14/2021) for Stone f/u with UA + KUB prior.  Carman Ching, PA-C  [facility] [address], [address] [place], [place] [address] [phone]

## 2021-03-22 LAB — URINALYSIS, COMPLETE

## 2021-04-17 NOTE — Progress Notes (Signed)
04/18/2021 10:40 AM   Eugene Cameron [birth year] [id number]  Referring provider: Su Ley, MD [id number] [place],  [place] [address]  Chief Complaint  Patient presents with   Nephrolithiasis   Urological history: 1.  Nephrolithiasis -spontaneous passage of left UPJ stone 2022 -3 mm stone in lower pole right kidney  HPI: Eugene Cameron is a 66 y.o. male who presents today for follow-up KUB and urinalysis for a right ureteral stone and microscopic hematuria.  At his visit in December 2022 with Sam, he developed the sudden onset of right-sided flank pain radiating to his left lower quadrant.  A KUB taken at that time noted interval migration of his right lower pole stone to the proximal right ureter at the level of L4-5.  He had greater than 30 RBCs on microscopy.  He was prescribed oxycodone, demonstrating tamsulosin along with a strainer so that he may engage in a trial of passage.  KUB today the right ureteral stone is no longer visible.  His UA is negative for micro heme.  Patient denies any modifying or aggravating factors.  Patient denies any gross hematuria, dysuria or suprapubic/flank pain.  Patient denies any fevers, chills, nausea or vomiting.    PMH: Past Medical History:  Diagnosis Date   Esophageal varices ([place])    s/p multiple bandings   Kidney stone    NASH (nonalcoholic steatohepatitis)    Spleen enlarged     Surgical History: Past Surgical History:  Procedure Laterality Date   ELBOW SURGERY Left     Home Medications:  Allergies as of 04/18/2021       Reactions   Gluten Meal    Other reaction(s): Other (See Comments) GI upset   Cortisone    Other reaction(s): Liver Disorder   Statins    Other reaction(s): Other (See Comments), Other (See Comments) Liver disease contraindication Liver disease contraindication        Medication List        Accurate as of April 18, 2021 11:59 PM. If you have any  questions, ask your nurse or doctor.          Accu-Chek Guide test strip Generic drug: glucose blood in the morning, at noon, and at bedtime.   BD Pen Needle Nano 2nd Gen 32G X 4 MM Misc Generic drug: Insulin Pen Needle daily.   carvedilol 6.25 MG tablet Commonly known as: COREG Take 1/2 tablet in morning, 1 tablet in evening   docusate sodium 100 MG capsule Commonly known as: Colace Take 1 tablet once or twice daily as needed for constipation while taking narcotic pain medicine   Farxiga 5 MG Tabs tablet Generic drug: dapagliflozin propanediol Take 5 mg by mouth daily.   fluticasone 50 MCG/ACT nasal spray Commonly known as: FLONASE Place 2 sprays into the nose as needed.   furosemide 20 MG tablet Commonly known as: LASIX Take 20 mg by mouth daily.   gabapentin 100 MG capsule Commonly known as: NEURONTIN Take 300 mg by mouth at bedtime.   glipiZIDE 10 MG tablet Commonly known as: GLUCOTROL Take 10 mg by mouth 2 (two) times daily.   ondansetron 4 MG disintegrating tablet Commonly known as: ZOFRAN-ODT Take 1 tablet (4 mg total) by mouth every 8 (eight) hours as needed for nausea or vomiting.   ondansetron 4 MG tablet Commonly known as: Zofran Take 1 tablet (4 mg total) by mouth daily as needed for nausea or vomiting.  oxyCODONE 5 MG immediate release tablet Commonly known as: Roxicodone Take 1-2 tablets (5-10 mg total) by mouth every 6 (six) hours as needed for severe pain.   pantoprazole 40 MG tablet Commonly known as: PROTONIX Take 1 tablet by mouth daily.   pioglitazone 30 MG tablet Commonly known as: ACTOS Take 30 mg by mouth daily.   RA Probiotic Digestive Care Caps Take 1 capsule by mouth daily.   tamsulosin 0.4 MG Caps capsule Commonly known as: FLOMAX Take 1 tablet by mouth daily until you pass the kidney stone or no longer have symptoms   Tresiba FlexTouch 100 UNIT/ML FlexTouch Pen Generic drug: insulin degludec Inject 50 Units into the  skin at bedtime.        Allergies:  Allergies  Allergen Reactions   Gluten Meal     Other reaction(s): Other (See Comments) GI upset   Cortisone     Other reaction(s): Liver Disorder   Statins     Other reaction(s): Other (See Comments), Other (See Comments) Liver disease contraindication Liver disease contraindication     Family History: No family history on file.  Social History:  reports that he has never smoked. He has never used smokeless tobacco. No history on file for alcohol use and drug use.  ROS: Pertinent ROS in HPI  Physical Exam: BP 120/77    Pulse (!) 59    Ht 6' (1.829 m)    Wt 219 lb (99.3 kg)    BMI 29.70 kg/m   Constitutional:  Well nourished. Alert and oriented, No acute distress. HEENT: [place] AT, mask in place.  Trachea midline Cardiovascular: No clubbing, cyanosis, or edema. Respiratory: Normal respiratory effort, no increased work of breathing. Neurologic: Grossly intact, no focal deficits, moving all 4 extremities. Psychiatric: Normal mood and affect.  Laboratory Data: WBC 3.6 - 11.2 10*9/L 1.2 Low Panic    RBC 4.26 - 5.60 10*12/L 4.28   HGB 12.9 - 16.5 g/dL 12.7 Low    HCT 39.0 - 48.0 % 38.8 Low    MCV 77.6 - 95.7 fL 90.6   MCH 25.9 - 32.4 pg 29.6   MCHC 32.0 - 36.0 g/dL 32.7   RDW 12.2 - 15.2 % 14.7   MPV 6.8 - 10.7 fL 10.0   Platelet 150 - 450 10*9/L 57 Low    Resulting Agency  [facility]  Specimen Collected: 04/14/21 12:35 Last Resulted: 04/14/21 14:33  Received From: [place]  Result Received: 04/17/21 10:20   Sodium 136 - 145 mmol/L 140   Potassium 3.5 - 5.1 mmol/L 4.4   Chloride 98 - 107 mmol/L 107   CO2 20.0 - 31.0 mmol/L 29.0   Anion Gap 3 - 11 mmol/L 4   BUN 9 - 23 mg/dL 21   Creatinine 0.60 - 1.10 mg/dL 1.30 High    BUN/Creatinine Ratio  16   eGFR CKD-EPI (2021) Male >=60 mL/min/1.67m 61   Comment: eGFR calculated with CKD-EPI 2021 equation in accordance with [employer] Task Force recommendations.  Glucose 70 - 179 mg/dL 155   Calcium 8.7 - 10.4 mg/dL 9.6   Albumin 3.4 - 5.0 g/dL 3.7   Total Protein 5.7 - 8.2 g/dL 6.8   Total Bilirubin 0.3 - 1.2 mg/dL 1.0   AST <34 U/L 37 High    ALT 10 - 49 U/L 31   Alkaline Phosphatase 46 - 116 U/L 82   [facility]  Specimen Collected:  04/07/21 09:25 Last Resulted: 04/07/21 10:44  Received From: [place]  Result Received: 04/17/21 10:20   Urinalysis Results for orders placed or performed in visit on 04/18/21  Microscopic Examination   Urine  Result Value Ref Range   WBC, UA 0-5 0 - 5 /hpf   RBC None seen 0 - 2 /hpf   Epithelial Cells (non renal) None seen 0 - 10 /hpf   Casts Present (A) None seen /lpf   Cast Type Hyaline casts N/A   Bacteria, UA None seen None seen/Few  Urinalysis, Complete  Result Value Ref Range   Specific Gravity, UA 1.010 1.005 - 1.030   pH, UA 5.0 5.0 - 7.5   Color, UA Yellow Yellow   Appearance Ur Clear Clear   Leukocytes,UA Negative Negative   Protein,UA Negative Negative/Trace   Glucose, UA 2+ (A) Negative   Ketones, UA Negative Negative   RBC, UA Negative Negative   Bilirubin, UA Negative Negative   Urobilinogen, Ur 0.2 0.2 - 1.0 mg/dL   Nitrite, UA Negative Negative   Microscopic Examination See below:     I have reviewed the labs.   Pertinent Imaging: CLINICAL DATA:  Right ureteral calculus, splenomegaly   EXAM: ABDOMEN - 1 VIEW   COMPARISON:  03/17/2021   FINDINGS: 2 supine frontal views of the abdomen and pelvis are obtained, excluding the bilateral flanks and hemidiaphragms by collimation. The 3 mm rounded calcification projecting over the right renal silhouette on prior study is not identified on this exam. Evaluation is limited by bowel gas and stool. No other urinary tract calculi are identified. Bowel gas pattern is unremarkable without obstruction or ileus. Stable splenomegaly.   IMPRESSION: 1. No  radiopaque calculus identified on this exam. 2. Stable splenomegaly.     Electronically Signed   By: Randa Ngo M.D.   On: 04/18/2021 15:15   I have independently reviewed the films.    Assessment & Plan:    1. Right ureteral stone -not visualized on KUB -Asymptomatic at this time -UA clear -Given handout on ABCs of stone prevention  2. Microscopic hematuria -resolved   Return in about 1 year (around 04/18/2022) for KUB and office visit .  These notes generated with voice recognition software. I apologize for typographical errors.  Zara Council, PA-C  [facility] [address]  [place], [place] [address] [phone]

## 2021-04-18 ENCOUNTER — Other Ambulatory Visit: Payer: Self-pay

## 2021-04-18 ENCOUNTER — Ambulatory Visit (INDEPENDENT_AMBULATORY_CARE_PROVIDER_SITE_OTHER): Payer: 59 | Admitting: Urology

## 2021-04-18 ENCOUNTER — Encounter: Payer: Self-pay | Admitting: Urology

## 2021-04-18 ENCOUNTER — Ambulatory Visit
Admission: RE | Admit: 2021-04-18 | Discharge: 2021-04-18 | Disposition: A | Discharge status: Home / Self Care | Payer: 59 | Source: Ambulatory Visit | Attending: Physician Assistant | Admitting: Physician Assistant

## 2021-04-18 ENCOUNTER — Ambulatory Visit
Admission: RE | Admit: 2021-04-18 | Discharge: 2021-04-18 | Disposition: A | Discharge status: Home / Self Care | Payer: 59 | Attending: Physician Assistant | Admitting: Physician Assistant

## 2021-04-18 VITALS — BP 120/77 | HR 59 | Ht 72.0 in | Wt 219.0 lb

## 2021-04-18 DIAGNOSIS — N201 Calculus of ureter: Secondary | ICD-10-CM | POA: Diagnosis present

## 2021-04-18 DIAGNOSIS — R3129 Other microscopic hematuria: Secondary | ICD-10-CM | POA: Diagnosis not present

## 2021-04-18 LAB — URINALYSIS, COMPLETE
Bilirubin, UA: NEGATIVE
Ketones, UA: NEGATIVE
Leukocytes,UA: NEGATIVE
Nitrite, UA: NEGATIVE
Protein,UA: NEGATIVE
RBC, UA: NEGATIVE
Specific Gravity, UA: 1.01 (ref 1.005–1.030)
Urobilinogen, Ur: 0.2 mg/dL (ref 0.2–1.0)
pH, UA: 5 (ref 5.0–7.5)

## 2021-04-18 LAB — MICROSCOPIC EXAMINATION
Bacteria, UA: NONE SEEN
Epithelial Cells (non renal): NONE SEEN /hpf (ref 0–10)
RBC, Urine: NONE SEEN /hpf (ref 0–2)

## 2021-06-04 ENCOUNTER — Other Ambulatory Visit: Payer: Self-pay

## 2021-06-04 ENCOUNTER — Emergency Department
Admission: EM | Admit: 2021-06-04 | Discharge: 2021-06-04 | Disposition: A | Discharge status: Home / Self Care | Payer: 59 | Attending: Emergency Medicine | Admitting: Emergency Medicine

## 2021-06-04 DIAGNOSIS — S81812A Laceration without foreign body, left lower leg, initial encounter: Secondary | ICD-10-CM | POA: Diagnosis not present

## 2021-06-04 DIAGNOSIS — E119 Type 2 diabetes mellitus without complications: Secondary | ICD-10-CM | POA: Diagnosis not present

## 2021-06-04 DIAGNOSIS — W293XXA Contact with powered garden and outdoor hand tools and machinery, initial encounter: Secondary | ICD-10-CM | POA: Diagnosis not present

## 2021-06-04 DIAGNOSIS — Z23 Encounter for immunization: Secondary | ICD-10-CM | POA: Diagnosis not present

## 2021-06-04 DIAGNOSIS — S8992XA Unspecified injury of left lower leg, initial encounter: Secondary | ICD-10-CM | POA: Diagnosis present

## 2021-06-04 MED ORDER — TETANUS-DIPHTH-ACELL PERTUSSIS 5-2.5-18.5 LF-MCG/0.5 IM SUSY
0.5000 mL | PREFILLED_SYRINGE | Freq: Once | INTRAMUSCULAR | Status: AC
  Administered 2021-06-04: 0.5 mL via INTRAMUSCULAR
  Filled 2021-06-04: qty 0.5

## 2021-06-04 MED ORDER — CEPHALEXIN 500 MG PO CAPS: 500.0000 mg | ORAL_CAPSULE | Freq: Two times a day (BID) | ORAL | 0 refills | Status: DC

## 2021-06-04 NOTE — ED Provider Notes (Signed)
? ?[facility] ?Provider Note ? ? ? Event Date/Time  ? First MD Initiated Contact with Patient 06/04/21 1306   ?  (approximate) ? ? ?History  ? ?Laceration ? ? ?HPI ? ?Eugene Cameron is a 66 y.o. male history of nonalcoholic liver disease, splenomegaly, neutropenia and thrombocytopenia and diabetes ? ?His normal state of health.  Was operating a chainsaw that he had just placed a new chain on when he was using it and he suddenly glanced his left knee area.  At first he did not think he had cut through his jeans but then noted warmth and bleeding from over the left knee. ? ?Has been able to walk on it.  Reports is a very clean straight cut.  He is not using a dirty sawblade at the time had just changed out for a new blade ? ?He is not sure of his last tetanus but believes he may need an updated ? ?No numbness or weakness in the leg.  Able to walk on it.  Immediately placed a bandage over it but slightly when it first happened, but immediately bandaged it.  Occurred about 30 minutes prior to arrival ?  ? ? ?Physical Exam  ? ?Triage Vital Signs: ?ED Triage Vitals  ?Enc Vitals Group  ?   BP 06/04/21 1307 122/67  ?   Pulse Rate 06/04/21 1307 80  ?   Resp 06/04/21 1307 18  ?   Temp 06/04/21 1307 97.9 ?F (36.6 ?C)  ?   Temp Source 06/04/21 1307 Oral  ?   SpO2 06/04/21 1307 95 %  ?   Weight 06/04/21 1304 220 lb (99.8 kg)  ?   Height 06/04/21 1304 6' (1.829 m)  ?   Head Circumference --   ?   Peak Flow --   ?   Pain Score 06/04/21 1304 3  ?   Pain Loc --   ?   Pain Edu? --   ?   Excl. in GC? --   ? ? ?Most recent vital signs: ?Vitals:  ? 06/04/21 1307  ?BP: 122/67  ?Pulse: 80  ?Resp: 18  ?Temp: 97.9 ?F (36.6 ?C)  ?SpO2: 95%  ? ? ? ?General: Awake, no distress.  ?CV:  Good peripheral perfusion.  Normal perfusion left lower extremity. ?Resp:  Normal effort.  Speaks full clear sentences ?Abd:  No distention.  ?Other:   ? ?Lower Extremities ? ?No edema. Normal DP/PT pulses bilateral with good cap  refill. ? ?Normal neuro-motor function lower extremities bilateral. ? ?RIGHT ?Right lower extremity demonstrates normal strength, good use of all muscles.  Grossly normal ? ?LEFT ?Left lower extremity demonstrates normal strength, good use of all muscles. No edema bruising or contusions of the hip,  knee, ankle. Full range of motion of the left lower extremity without pain.  6 cm linear none macerated.  Involves the dermal layers only, explored carefully and there is no penetration into the overlying knee joint, fascia or past the dermis.  Bleeding is minimal oozing, no arterial bleeding.  No foreign bodies dirt or other contaminants noted. ? ?Patient reports only area injured his just of his left knee ? ? ?ED Results / Procedures / Treatments  ? ?Labs ?(all labs ordered are listed, but only abnormal results are displayed) ?Labs Reviewed - No data to display ? ? ?EKG ? ? ? ? ?RADIOLOGY ? ?No indication for imaging.  No concerns for deep injury or joint involvement.  Full range of motion  of the left knee without defect, denies pain except right at the laceration site ? ? ?PROCEDURES: ? ?Critical Care performed: No ? ?Marland Kitchen.Laceration Repair ? ?Date/Time: 06/04/2021 1:53 PM ?Performed by: Sharyn Creamer, MD ?Authorized by: Sharyn Creamer, MD  ? ?Consent:  ?  Consent obtained:  Verbal ?  Consent given by:  Patient ?  Risks discussed:  Infection, pain, retained foreign body and poor wound healing ?Universal protocol:  ?  Procedure explained and questions answered to patient or proxy's satisfaction: yes   ?  Relevant documents present and verified: yes   ?  Imaging studies available: yes   ?  Patient identity confirmed:  Verbally with patient ?Anesthesia:  ?  Anesthesia method:  Local infiltration ?  Local anesthetic:  Lidocaine 1% WITH epi (8 ml) ?Laceration details:  ?  Location:  Leg ?  Leg location:  L knee ?  Length (cm):  6 ?  Depth (mm):  3 ?Pre-procedure details:  ?  Preparation:  Patient was prepped and draped in usual  sterile fashion ?Exploration:  ?  Limited defect created (wound extended): no   ?  Hemostasis achieved with:  Direct pressure ?  Imaging outcome: foreign body not noted   ?  Wound exploration: wound explored through full range of motion and entire depth of wound visualized   ?  Wound extent: no areolar tissue violation noted, no fascia violation noted, no foreign bodies/material noted, no muscle damage noted, no nerve damage noted, no tendon damage noted, no underlying fracture noted and no vascular damage noted   ?  Contaminated: no   ?Treatment:  ?  Area cleansed with:  Saline and povidone-iodine ?  Amount of cleaning:  Extensive ?  Irrigation solution:  Sterile saline ?  Irrigation volume:  500 ?  Irrigation method:  Syringe ?  Visualized foreign bodies/material removed: no   ?  Debridement:  None ?  Undermining:  None ?Skin repair:  ?  Repair method:  Sutures ?  Suture size:  2-0 ?  Suture technique:  Running ?  Number of sutures:  2 ?Approximation:  ?  Approximation:  Close ?Repair type:  ?  Repair type:  Simple ?Post-procedure details:  ?  Dressing:  Sterile dressing ?  Procedure completion:  Tolerated well, no immediate complications ? ? ?MEDICATIONS ORDERED IN ED: ?Medications  ?Tdap (BOOSTRIX) injection 0.5 mL (0.5 mLs Intramuscular Given 06/04/21 1344)  ? ? ? ?IMPRESSION / MDM / ASSESSMENT AND PLAN / ED COURSE  ?I reviewed the triage vital signs and the nursing notes. ?             ?               ? ?Differential diagnosis includes, but is not limited to, chainsaw injury.  Thankfully patient reports he just placed a clean blade on it.  Knee did not have any evidence of contamination or organic materials in the wound.  The wound is linear and superficial, suggested that he just had a very glancing type of laceration from a chainsaw.  Tetanus shot given.  Will provide prophylactic antibiotic.  Do not see indication for imaging there is no signs or symptoms of suggest deeper injury or joint space involvement or  injury of the patella. ? ?Patient into a knee immobilizer to use for the next 48 hours.  Given his low platelet count, I did discuss with him and his wife very careful return precautions around bleeding, and also signs and symptoms to  watch for and return precautions around infection. ? ?Independently reviewed the patient's labs from his previous hospital lab draw where he is noted to have thrombocytopenia and leukopenia ? ? ?  ? ? ?FINAL CLINICAL IMPRESSION(S) / ED DIAGNOSES  ? ?Final diagnoses:  ?Laceration of left lower extremity, initial encounter  ? ? ? ?Rx / DC Orders  ? ?ED Discharge Orders   ? ?      Ordered  ?  cephALEXin (KEFLEX) 500 MG capsule  2 times daily       ? 06/04/21 1349  ? ?  ?  ? ?  ? ? ? ?Note:  This document was prepared using Dragon voice recognition software and may include unintentional dictation errors. ?  Sharyn Creamer, MD ?06/04/21 1357 ? ?

## 2021-06-04 NOTE — ED Triage Notes (Addendum)
Pt comes pov with lac to left knee area from chainsaw. Bleeding mostly controlled with gauze at this time-starting to seep through bandage.  ?

## 2021-06-04 NOTE — Discharge Instructions (Signed)
You have been seen in the Emergency Department (ED) today for a laceration (cut).  Please keep the cut clean but do not submerge it in the water.  It has been repaired with staples or sutures that will need to be removed in about 10 days. Please follow up with your doctor, an urgent care, or return to the ED for suture removal.    ° °Please follow up with your doctor as soon as possible regarding today's emergent visit.  ° °Return to the ED or call your doctor if you notice any signs of infection such as fever, increased pain, increased redness, pus, or other symptoms that concern you. ° °

## 2021-06-14 ENCOUNTER — Ambulatory Visit
Admission: RE | Admit: 2021-06-14 | Discharge: 2021-06-14 | Disposition: A | Discharge status: Home / Self Care | Payer: 59 | Source: Ambulatory Visit | Attending: Emergency Medicine | Admitting: Emergency Medicine

## 2021-06-14 ENCOUNTER — Other Ambulatory Visit: Payer: Self-pay

## 2021-06-14 DIAGNOSIS — S81812D Laceration without foreign body, left lower leg, subsequent encounter: Secondary | ICD-10-CM

## 2021-06-14 DIAGNOSIS — Z4802 Encounter for removal of sutures: Secondary | ICD-10-CM

## 2021-06-14 NOTE — ED Triage Notes (Signed)
Pt was seen at [facility] ED for laceration repair on 03/05. Patient was instructed to return on day 10 for suture removal. Per ED provider notes 2 running sutures placed in left knee. No signs of infection noted and no S/S reported by patient. Alycia Rossetti, NP called to bedside and site assessed. 2 running sutures removed; patient tolerated well. Wound care education provided per Alycia Rossetti, NP recommendations. Patient voiced understanding.  ?

## 2022-01-25 DIAGNOSIS — M2540 Effusion, unspecified joint: Secondary | ICD-10-CM | POA: Insufficient documentation

## 2022-02-14 ENCOUNTER — Encounter: Payer: Self-pay | Admitting: Urology

## 2022-02-14 ENCOUNTER — Ambulatory Visit (INDEPENDENT_AMBULATORY_CARE_PROVIDER_SITE_OTHER): Payer: 59 | Admitting: Urology

## 2022-02-14 ENCOUNTER — Telehealth: Payer: Self-pay

## 2022-02-14 ENCOUNTER — Emergency Department
Admission: RE | Admit: 2022-02-14 | Discharge: 2022-02-14 | Disposition: A | Discharge status: Home / Self Care | Payer: 59 | Source: Ambulatory Visit | Attending: Urology | Admitting: Urology

## 2022-02-14 ENCOUNTER — Ambulatory Visit
Admission: EM | Admit: 2022-02-14 | Discharge: 2022-02-14 | Disposition: A | Discharge status: Home / Self Care | Payer: 59 | Attending: Urgent Care | Admitting: Urgent Care

## 2022-02-14 VITALS — BP 108/65 | HR 82 | Temp 98.7°F

## 2022-02-14 DIAGNOSIS — Z1152 Encounter for screening for COVID-19: Secondary | ICD-10-CM | POA: Insufficient documentation

## 2022-02-14 DIAGNOSIS — D709 Neutropenia, unspecified: Secondary | ICD-10-CM | POA: Diagnosis not present

## 2022-02-14 DIAGNOSIS — R6889 Other general symptoms and signs: Secondary | ICD-10-CM | POA: Diagnosis present

## 2022-02-14 DIAGNOSIS — R161 Splenomegaly, not elsewhere classified: Secondary | ICD-10-CM | POA: Diagnosis not present

## 2022-02-14 DIAGNOSIS — Z79899 Other long term (current) drug therapy: Secondary | ICD-10-CM | POA: Insufficient documentation

## 2022-02-14 DIAGNOSIS — B349 Viral infection, unspecified: Secondary | ICD-10-CM | POA: Diagnosis not present

## 2022-02-14 DIAGNOSIS — R109 Unspecified abdominal pain: Secondary | ICD-10-CM | POA: Diagnosis not present

## 2022-02-14 LAB — URINALYSIS, COMPLETE

## 2022-02-14 LAB — MICROSCOPIC EXAMINATION
Bacteria, UA: NONE SEEN
Epithelial Cells (non renal): NONE SEEN /hpf (ref 0–10)

## 2022-02-14 LAB — RESP PANEL BY RT-PCR (RSV, FLU A&B, COVID)  RVPGX2
Influenza A by PCR: NEGATIVE
Influenza B by PCR: NEGATIVE
Resp Syncytial Virus by PCR: NEGATIVE
SARS Coronavirus 2 by RT PCR: NEGATIVE

## 2022-02-14 MED ORDER — OSELTAMIVIR PHOSPHATE 75 MG PO CAPS: 75.0000 mg | ORAL_CAPSULE | Freq: Two times a day (BID) | ORAL | 0 refills | Status: DC

## 2022-02-14 NOTE — Telephone Encounter (Signed)
Called pt back, wife answers she states that patient's sx started yesterday 02/13/22 around 4pm, fever was up to 101 last night, she has given patient Tylenol every 4hrs since then and temp is currently 99.3. Pt c/o lower back pain. Denies dysuria, has taken 1 tablet of AZO. Patient has had no appetite today. Advised pt to come into office ASAP per Michiel Cowboy, PA. Wife voiced understanding.

## 2022-02-14 NOTE — Discharge Instructions (Addendum)
Follow up here or with your primary care provider if your symptoms are worsening or not improving.     

## 2022-02-14 NOTE — ED Provider Notes (Addendum)
[facility]    CSN: [id number] Arrival date & time: 02/14/22  1645      History   Chief Complaint Chief Complaint  Patient presents with   Fever    Need to be tested for Flu - Entered by patient   Generalized Body Aches   Chills   Nausea    HPI Eugene Cameron is a 66 y.o. male.    Fever   Presents to UC with complaint of fever, body aches, chills, nausea starting yesterday.  Denies significant cough.  Denies nasal congestion.  Denies vomiting or diarrhea.  Past Medical History:  Diagnosis Date   Esophageal varices (HCC)    s/p multiple bandings   Kidney stone    NASH (nonalcoholic steatohepatitis)    Spleen enlarged     Patient Active Problem List   Diagnosis Date Noted   Joint swelling 01/25/2022   Kidney stone    Hematochezia 06/05/2020   Left lateral epicondylitis 05/24/2020   Splenomegaly 11/11/2017   Chronic abdominal pain 03/22/2016   Arthritis of right acromioclavicular joint 08/19/2015   Insomnia 06/16/2015   Dupuytren's disease of palm 04/22/2015   Myofascial pain 11/16/2014   Adhesive capsulitis of right shoulder 06/30/2014   Hypertension associated with diabetes (HCC) 09/11/2013   Cirrhosis, 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 Authorizing Provider  Continuous Blood Gluc Sensor (DEXCOM G7 SENSOR) MISC USE 1 EACH EVERY 10 (TEN) DAYS 09/01/21  Yes [provider]  DOPTELET 20 MG  TABS Take by mouth. 02/13/22  Yes [provider]  ACCU-CHEK GUIDE test strip in the morning, at noon, and at bedtime. 07/12/20   [provider]  BD PEN NEEDLE NANO 2ND GEN 32G X 4 MM MISC daily. 06/23/20   [provider]  carvedilol (COREG) 6.25 MG tablet Take 1/2 tablet in morning, 1 tablet in evening 05/02/20   [provider]  cephALEXin (KEFLEX) 500 MG capsule Take 1 capsule (500 mg total) by mouth 2 (two) times daily. Patient not taking: Reported on 02/14/2022 06/04/21   Sharyn Creamer, MD  docusate sodium (COLACE) 100 MG capsule Take 1 tablet once or twice daily as needed for constipation while taking narcotic pain medicine Patient not taking: Reported on 04/18/2021 07/27/20   Loleta Rose, MD  FARXIGA 5 MG TABS tablet Take 5 mg by mouth daily. 07/28/20   [provider]  fluticasone (FLONASE) 50 MCG/ACT nasal spray Place 2 sprays into the nose as needed. 07/13/19   [provider]  furosemide (LASIX) 20 MG tablet Take 20 mg by mouth daily. 06/10/20   [provider]  gabapentin (NEURONTIN) 100 MG capsule Take 300 mg by mouth at bedtime. 05/02/20   [provider]  glipiZIDE (GLUCOTROL) 10 MG tablet Take 10 mg by mouth 2 (two) times daily. 05/15/20   [provider]  Lactobacillus Rhamnosus, GG, (RA PROBIOTIC DIGESTIVE CARE) CAPS Take 1 capsule by mouth daily.  [provider]  metFORMIN (GLUCOPHAGE) 500 MG tablet Take by mouth.    [provider]  ondansetron (ZOFRAN-ODT) 4 MG disintegrating tablet Take 1 tablet (4 mg total) by mouth every 8 (eight) hours as needed for nausea or vomiting. 03/17/21   Vaillancourt, Lelon MastSamantha, PA-C  oxyCODONE (ROXICODONE) 5 MG immediate release tablet Take 1-2 tablets (5-10 mg total) by mouth every 6 (six) hours as needed for severe pain. Patient not taking: Reported on 02/14/2022 03/17/21   Carman ChingVaillancourt, Samantha, PA-C  pantoprazole (PROTONIX) 40 MG tablet Take 1 tablet by  mouth daily. 07/21/20   [provider]  pioglitazone (ACTOS) 30 MG tablet Take 30 mg by mouth daily. 05/15/20   [provider]  tamsulosin (FLOMAX) 0.4 MG CAPS capsule Take 1 tablet by mouth daily until you pass the kidney stone or no longer have symptoms 03/17/21   Vaillancourt, Samantha, PA-C  TRESIBA FLEXTOUCH 100 UNIT/ML FlexTouch Pen Inject 50 Units into the skin at bedtime. 07/11/20   [provider]    Family History History reviewed. No pertinent family history.  Social History Social History   Tobacco Use   Smoking status: Never   Smokeless tobacco: Never     Allergies   Gluten meal, Cortisone, and Statins   Review of Systems Review of Systems  Constitutional:  Positive for fever.     Physical Exam Triage Vital Signs ED Triage Vitals  Enc Vitals Group     BP 02/14/22 1734 107/68     Pulse Rate 02/14/22 1734 75     Resp 02/14/22 1734 15     Temp 02/14/22 1734 98.7 F (37.1 C)     Temp src --      SpO2 02/14/22 1734 95 %     Weight --      Height --      Head Circumference --      Peak Flow --      Pain Score 02/14/22 1727 0     Pain Loc --      Pain Edu? --      Excl. in GC? --    No data found.  Updated Vital Signs BP 107/68   Pulse 75   Temp 98.7 F (37.1 C)   Resp 15   SpO2 95%   Visual Acuity Right Eye Distance:   Left Eye Distance:   Bilateral Distance:    Right Eye Near:   Left Eye Near:    Bilateral Near:     Physical Exam Vitals reviewed.  Constitutional:      Appearance: He is ill-appearing.  Eyes:     Conjunctiva/sclera: Conjunctivae normal.     Pupils: Pupils are equal, round, and reactive to light.  Skin:    General: Skin is warm and dry.  Neurological:     General: No focal deficit present.     Mental Status: He is alert and oriented to person, place, and time.  Psychiatric:        Mood and Affect: Mood normal.        Behavior: Behavior normal.      UC Treatments / Results  Labs (all  labs ordered are listed, but only abnormal results are displayed) Labs Reviewed  RESP PANEL BY RT-PCR (RSV, FLU A&B, COVID)  [id number]    EKG   Radiology CT RENAL STONE STUDY  Result Date: 02/14/2022 CLINICAL DATA:  History of flank pain without urinary symptoms in a 66 year old EXAM: CT ABDOMEN AND PELVIS WITHOUT CONTRAST TECHNIQUE:  Multidetector CT imaging of the abdomen and pelvis was performed following the standard protocol without IV contrast. RADIATION DOSE REDUCTION: This exam was performed according to the departmental dose-optimization program which includes automated exposure control, adjustment of the mA and/or kV according to patient size and/or use of iterative reconstruction technique. COMPARISON:  Aug 02, 2020 FINDINGS: Lower chest: Basilar atelectasis. No effusion. No consolidative changes. Heart size is normal without pericardial effusion. There is no chest wall abnormality. Hepatobiliary: Lobular hepatic contours compatible with cirrhosis with marked fissural widening of hepatic fissures. No gross biliary duct distension following cholecystectomy. Pancreas: Pancreas with normal contours, no signs of inflammation or peripancreatic fluid. Spleen: Massive splenomegaly measuring 20 x 13 x 25 (volume = 3400 cc) cm as compared to 21 x 10 x 23 (volume = 2500 cc. No stranding adjacent to the spleen. Homogeneous splenic attenuation.) Cm Adrenals/Urinary Tract: Smooth renal contours. Mild perinephric stranding. No nephrolithiasis, hydronephrosis or substantial perivesical stranding at this time. Normal adrenal glands. Stomach/Bowel: Varices adjacent to the distal esophagus at about the stomach. No acute gastrointestinal findings. Appendix not visualized. No secondary signs to suggest acute appendiceal process however. Vascular/Lymphatic: Subtle calcifications of the portal vein and splenic vein along the wall of these veins tracking also into the SMV also seen on previous imaging. No aortic  dilation. Portosystemic collaterals about the abdomen which are similar to the previous study. No adenopathy in the abdomen or in the pelvis. Reproductive: Unremarkable by CT. Other: No ascites Musculoskeletal: No acute bone finding. No destructive bone process. Spinal degenerative changes. IMPRESSION: 1. No nephrolithiasis, hydronephrosis or substantial perivesical stranding at this time. 2. Massive splenomegaly with increase in measured volume, proximally 30% increase. This could explain LEFT-sided flank pain. Based on splenic size complication such as splenic rupture can be a concern though there are no current secondary signs that would suggest this possibility. If symptoms worsen consider further evaluation with contrasted imaging. 3. Signs of cirrhosis and portal hypertension as before. 4. Subtle calcifications of the portal vein and splenic vein along the wall of these veins tracking also into the SMV also seen on previous imaging. Findings are nonspecific but can be seen in the setting of chronic portal vein thrombosis. These results will be called to the ordering clinician or representative by the Radiologist Assistant, and communication documented in the PACS or [employer]. Electronically Signed   By: Donzetta Kohut M.D.   On: 02/14/2022 16:25    Procedures Procedures (including critical care time)  Medications Ordered in UC Medications - No data to display  Initial Impression / Assessment and Plan / UC Course  I have reviewed the triage vital signs and the nursing notes.  Pertinent labs & imaging results that were available during my care of the patient were reviewed by me and considered in my medical decision making (see chart for details).   Patient is afebrile here with recent antipyretics. Satting well on room air. Overall is ill appearing, though well hydrated, without respiratory distress. Pulmonary exam is unremarkable.   Suspect viral infection, possibly influenza.  Will treat  presumptively with Tamiflu awaiting respiratory swab results. Ordering testing for flu, covid, RSV given his risk for poor outcome in the context of poorly controlled DM2, pancytopenia, esophageal varices, CKD.  Final Clinical Impressions(s) / UC Diagnoses   Final diagnoses:  Flu-like symptoms   Discharge Instructions   None    ED Prescriptions   None    PDMP not reviewed this encounter.   Cobi Delph, Jeannett Senior, FNP  02/14/22 1810    ImmordinoJeannett Senior, FNP 03/09/22 1456    ImmordinoJeannett Senior, FNP 03/09/22 1459

## 2022-02-14 NOTE — Progress Notes (Signed)
02/14/2022 5:00 PM   Eugene Cameron 1955/12/08 [id number]  Referring provider: Jackson Latino, MD [address] [facility] [address] [place],  [place] [address]  Urological history: 1.  Nephrolithiasis -spontaneous passage of left UPJ stone 2022 -3 mm stone in lower pole right kidney  HPI: Eugene Cameron is a 66 y.o. male who presents today for follow-up for fevers and severe back pain.  He states about 4 PM yesterday afternoon he had the sudden onset of shaking chills.  His wife took his temperature and is 101.3.  He has been receiving Tylenol every 4 hours to keep the fevers down.  He is also been experiencing lower back pain, muscle aches all over and a headache.  He is also had some nausea for which she took Zofran.  He has not had any urinary symptoms.  Patient denies any modifying or aggravating factors.  Patient denies any gross hematuria, dysuria or suprapubic/flank pain.  Patient denies any fevers, chills, nausea or vomiting.    UA patient has been on Azo so dip is not performed, microscopic examination notes 0-5 WBCs, 0-2 RBCs and no epithelial cells.  PVR 0 mL   CT renal stone study showed no stones, but he did have an increase in his splenomegaly.  PMH: Past Medical History:  Diagnosis Date   Esophageal varices (HCC)    s/p multiple bandings   Kidney stone    NASH (nonalcoholic steatohepatitis)    Spleen enlarged     Surgical History: Past Surgical History:  Procedure Laterality Date   ELBOW SURGERY Left     Home Medications:  Allergies as of 02/14/2022       Reactions   Gluten Meal    Other reaction(s): Other (See Comments) GI upset   Cortisone    Other reaction(s): Liver Disorder   Statins    Other reaction(s): Other (See Comments), Other (See Comments) Liver disease contraindication Liver disease contraindication        Medication List        Accurate as of February 14, 2022  5:00 PM. If you have any questions, ask your  nurse or doctor.          Accu-Chek Guide test strip Generic drug: glucose blood in the morning, at noon, and at bedtime.   BD Pen Needle Nano 2nd Gen 32G X 4 MM Misc Generic drug: Insulin Pen Needle daily.   carvedilol 6.25 MG tablet Commonly known as: COREG Take 1/2 tablet in morning, 1 tablet in evening   cephALEXin 500 MG capsule Commonly known as: KEFLEX Take 1 capsule (500 mg total) by mouth 2 (two) times daily.   docusate sodium 100 MG capsule Commonly known as: Colace Take 1 tablet once or twice daily as needed for constipation while taking narcotic pain medicine   Farxiga 5 MG Tabs tablet Generic drug: dapagliflozin propanediol Take 5 mg by mouth daily.   fluticasone 50 MCG/ACT nasal spray Commonly known as: FLONASE Place 2 sprays into the nose as needed.   furosemide 20 MG tablet Commonly known as: LASIX Take 20 mg by mouth daily.   gabapentin 100 MG capsule Commonly known as: NEURONTIN Take 300 mg by mouth at bedtime.   glipiZIDE 10 MG tablet Commonly known as: GLUCOTROL Take 10 mg by mouth 2 (two) times daily.   ondansetron 4 MG disintegrating tablet Commonly known as: ZOFRAN-ODT Take 1 tablet (4 mg total) by mouth every 8 (eight) hours as needed for nausea  or vomiting.   oxyCODONE 5 MG immediate release tablet Commonly known as: Roxicodone Take 1-2 tablets (5-10 mg total) by mouth every 6 (six) hours as needed for severe pain.   pantoprazole 40 MG tablet Commonly known as: PROTONIX Take 1 tablet by mouth daily.   pioglitazone 30 MG tablet Commonly known as: ACTOS Take 30 mg by mouth daily.   RA Probiotic Digestive Care Caps Take 1 capsule by mouth daily.   tamsulosin 0.4 MG Caps capsule Commonly known as: FLOMAX Take 1 tablet by mouth daily until you pass the kidney stone or no longer have symptoms   Tresiba FlexTouch 100 UNIT/ML FlexTouch Pen Generic drug: insulin degludec Inject 50 Units into the skin at bedtime.         Allergies:  Allergies  Allergen Reactions   Gluten Meal     Other reaction(s): Other (See Comments) GI upset   Cortisone     Other reaction(s): Liver Disorder   Statins     Other reaction(s): Other (See Comments), Other (See Comments) Liver disease contraindication Liver disease contraindication     Family History: No family history on file.  Social History:  reports that he has never smoked. He has never used smokeless tobacco. No history on file for alcohol use and drug use.  ROS: Pertinent ROS in HPI  Physical Exam: BP 108/65   Pulse 82   Temp 98.7 F (37.1 C) (Oral)   Constitutional:  Well nourished. Alert and oriented, No acute distress. HEENT: [place] AT, mask in place.  Trachea midline Cardiovascular: No clubbing, cyanosis, or edema. Respiratory: Normal respiratory effort, no increased work of breathing. Neurologic: Grossly intact, no focal deficits, moving all 4 extremities. Psychiatric: Normal mood and affect.  Laboratory Data: Urinalysis See EPIC and HPI  I have reviewed the labs.   Pertinent Imaging: CLINICAL DATA:  History of flank pain without urinary symptoms in a 66 year old   EXAM: CT ABDOMEN AND PELVIS WITHOUT CONTRAST   TECHNIQUE: Multidetector CT imaging of the abdomen and pelvis was performed following the standard protocol without IV contrast.   RADIATION DOSE REDUCTION: This exam was performed according to the departmental dose-optimization program which includes automated exposure control, adjustment of the mA and/or kV according to patient size and/or use of iterative reconstruction technique.   COMPARISON:  Aug 02, 2020   FINDINGS: Lower chest: Basilar atelectasis. No effusion. No consolidative changes. Heart size is normal without pericardial effusion. There is no chest wall abnormality.   Hepatobiliary: Lobular hepatic contours compatible with cirrhosis with marked fissural widening of hepatic fissures. No gross biliary duct  distension following cholecystectomy.   Pancreas: Pancreas with normal contours, no signs of inflammation or peripancreatic fluid.   Spleen: Massive splenomegaly measuring 20 x 13 x 25 (volume = 3400 cc) cm as compared to 21 x 10 x 23 (volume = 2500 cc. No stranding adjacent to the spleen. Homogeneous splenic attenuation.) Cm   Adrenals/Urinary Tract: Smooth renal contours. Mild perinephric stranding. No nephrolithiasis, hydronephrosis or substantial perivesical stranding at this time. Normal adrenal glands.   Stomach/Bowel: Varices adjacent to the distal esophagus at about the stomach. No acute gastrointestinal findings. Appendix not visualized. No secondary signs to suggest acute appendiceal process however.   Vascular/Lymphatic: Subtle calcifications of the portal vein and splenic vein along the wall of these veins tracking also into the SMV also seen on previous imaging. No aortic dilation. Portosystemic collaterals about the abdomen which are similar to the previous study. No adenopathy in the abdomen  or in the pelvis.   Reproductive: Unremarkable by CT.   Other: No ascites   Musculoskeletal: No acute bone finding. No destructive bone process. Spinal degenerative changes.   IMPRESSION: 1. No nephrolithiasis, hydronephrosis or substantial perivesical stranding at this time. 2. Massive splenomegaly with increase in measured volume, proximally 30% increase. This could explain LEFT-sided flank pain. Based on splenic size complication such as splenic rupture can be a concern though there are no current secondary signs that would suggest this possibility. If symptoms worsen consider further evaluation with contrasted imaging. 3. Signs of cirrhosis and portal hypertension as before. 4. Subtle calcifications of the portal vein and splenic vein along the wall of these veins tracking also into the SMV also seen on previous imaging. Findings are nonspecific but can be seen in  the setting of chronic portal vein thrombosis.   These results will be called to the ordering clinician or representative by the Radiologist Assistant, and communication documented in the PACS or [employer].     Electronically Signed   By: Donzetta Kohut M.D.   On: 02/14/2022 16:25 I have independently reviewed the films.    Assessment & Plan:    1. Viral illness -UA benign -STAT CT no stones -Advised patient and his wife to seek treatment in urgent care so they can get tested for flu and other viral illnesses  2. Splenomegaly -Advised them through MyChart to notify the gastroenterologist and his primary care physician of the findings of the splenomegaly  Return for return as scheduled w/ Dr. Richardo Hanks in January .  These notes generated with voice recognition software. I apologize for typographical errors.  Cloretta Ned  [facility] [address]  [address] [place], [place] [address] [phone]

## 2022-02-14 NOTE — Telephone Encounter (Signed)
Message left on triage line from pt's wife who states the patient believes he may be passing another kidney stone. Last night the patient's temp was 101, down to 99.4 today, pt is having chills, and severe flank pain. Pt has hx of stones, last seen January 2023. Please advise.

## 2022-02-14 NOTE — ED Triage Notes (Signed)
Pt. Presents to UC w/ c/o a fever, body aches, chills and nausea that started yesterday. Pt. Expresses concern for the flu.

## 2022-02-16 ENCOUNTER — Emergency Department: Payer: 59

## 2022-02-16 ENCOUNTER — Other Ambulatory Visit: Payer: Self-pay

## 2022-02-16 ENCOUNTER — Inpatient Hospital Stay
Admission: EM | Admit: 2022-02-16 | Discharge: 2022-02-19 | DRG: 809 | Disposition: A | Discharge status: Home / Self Care | Payer: 59 | Attending: Internal Medicine | Admitting: Internal Medicine

## 2022-02-16 DIAGNOSIS — K3189 Other diseases of stomach and duodenum: Secondary | ICD-10-CM | POA: Diagnosis present

## 2022-02-16 DIAGNOSIS — Z888 Allergy status to other drugs, medicaments and biological substances status: Secondary | ICD-10-CM | POA: Diagnosis not present

## 2022-02-16 DIAGNOSIS — N1831 Chronic kidney disease, stage 3a: Secondary | ICD-10-CM | POA: Diagnosis present

## 2022-02-16 DIAGNOSIS — Z794 Long term (current) use of insulin: Secondary | ICD-10-CM | POA: Diagnosis not present

## 2022-02-16 DIAGNOSIS — K900 Celiac disease: Secondary | ICD-10-CM | POA: Diagnosis present

## 2022-02-16 DIAGNOSIS — E1122 Type 2 diabetes mellitus with diabetic chronic kidney disease: Secondary | ICD-10-CM | POA: Diagnosis present

## 2022-02-16 DIAGNOSIS — D696 Thrombocytopenia, unspecified: Secondary | ICD-10-CM | POA: Diagnosis present

## 2022-02-16 DIAGNOSIS — R5081 Fever presenting with conditions classified elsewhere: Secondary | ICD-10-CM | POA: Diagnosis not present

## 2022-02-16 DIAGNOSIS — E1129 Type 2 diabetes mellitus with other diabetic kidney complication: Secondary | ICD-10-CM | POA: Diagnosis present

## 2022-02-16 DIAGNOSIS — R6889 Other general symptoms and signs: Secondary | ICD-10-CM | POA: Diagnosis present

## 2022-02-16 DIAGNOSIS — Z79899 Other long term (current) drug therapy: Secondary | ICD-10-CM

## 2022-02-16 DIAGNOSIS — I2510 Atherosclerotic heart disease of native coronary artery without angina pectoris: Secondary | ICD-10-CM | POA: Diagnosis present

## 2022-02-16 DIAGNOSIS — I8510 Secondary esophageal varices without bleeding: Secondary | ICD-10-CM | POA: Diagnosis present

## 2022-02-16 DIAGNOSIS — K7460 Unspecified cirrhosis of liver: Secondary | ICD-10-CM | POA: Diagnosis present

## 2022-02-16 DIAGNOSIS — Z1152 Encounter for screening for COVID-19: Secondary | ICD-10-CM | POA: Diagnosis not present

## 2022-02-16 DIAGNOSIS — Z79890 Hormone replacement therapy (postmenopausal): Secondary | ICD-10-CM

## 2022-02-16 DIAGNOSIS — B955 Unspecified streptococcus as the cause of diseases classified elsewhere: Secondary | ICD-10-CM | POA: Diagnosis present

## 2022-02-16 DIAGNOSIS — K7581 Nonalcoholic steatohepatitis (NASH): Secondary | ICD-10-CM | POA: Diagnosis present

## 2022-02-16 DIAGNOSIS — D72819 Decreased white blood cell count, unspecified: Secondary | ICD-10-CM

## 2022-02-16 DIAGNOSIS — R7881 Bacteremia: Secondary | ICD-10-CM

## 2022-02-16 DIAGNOSIS — E1165 Type 2 diabetes mellitus with hyperglycemia: Secondary | ICD-10-CM | POA: Diagnosis present

## 2022-02-16 DIAGNOSIS — K766 Portal hypertension: Secondary | ICD-10-CM | POA: Diagnosis present

## 2022-02-16 DIAGNOSIS — D709 Neutropenia, unspecified: Principal | ICD-10-CM | POA: Diagnosis present

## 2022-02-16 DIAGNOSIS — R161 Splenomegaly, not elsewhere classified: Secondary | ICD-10-CM | POA: Diagnosis present

## 2022-02-16 DIAGNOSIS — Z87442 Personal history of urinary calculi: Secondary | ICD-10-CM

## 2022-02-16 DIAGNOSIS — J9811 Atelectasis: Secondary | ICD-10-CM | POA: Diagnosis present

## 2022-02-16 DIAGNOSIS — R509 Fever, unspecified: Principal | ICD-10-CM

## 2022-02-16 LAB — CBC
HCT: 40.5 % (ref 39.0–52.0)
Hemoglobin: 13.6 g/dL (ref 13.0–17.0)
MCH: 28.1 pg (ref 26.0–34.0)
MCHC: 33.6 g/dL (ref 30.0–36.0)
MCV: 83.7 fL (ref 80.0–100.0)
Platelets: 19 10*3/uL — CL (ref 150–400)
RBC: 4.84 MIL/uL (ref 4.22–5.81)
RDW: 16.8 % — ABNORMAL HIGH (ref 11.5–15.5)
WBC: 0.9 10*3/uL — CL (ref 4.0–10.5)
nRBC: 0 % (ref 0.0–0.2)

## 2022-02-16 LAB — BASIC METABOLIC PANEL
Anion gap: 7 (ref 5–15)
BUN: 21 mg/dL (ref 8–23)
CO2: 22 mmol/L (ref 22–32)
Calcium: 8.6 mg/dL — ABNORMAL LOW (ref 8.9–10.3)
Chloride: 105 mmol/L (ref 98–111)
Creatinine, Ser: 1.4 mg/dL — ABNORMAL HIGH (ref 0.61–1.24)
GFR, Estimated: 56 mL/min — ABNORMAL LOW (ref >60)
Glucose, Bld: 206 mg/dL — ABNORMAL HIGH (ref 70–99)
Potassium: 4.3 mmol/L (ref 3.5–5.1)
Sodium: 134 mmol/L — ABNORMAL LOW (ref 135–145)

## 2022-02-16 LAB — CBC WITH DIFFERENTIAL/PLATELET
Abs Immature Granulocytes: 0 10*3/uL (ref 0.00–0.07)
Basophils Absolute: 0 10*3/uL (ref 0.0–0.1)
Basophils Relative: 0 %
Eosinophils Absolute: 0 10*3/uL (ref 0.0–0.5)
Eosinophils Relative: 0 %
HCT: 40.6 % (ref 39.0–52.0)
Hemoglobin: 13.6 g/dL (ref 13.0–17.0)
Immature Granulocytes: 0 %
Lymphocytes Relative: 26 %
Lymphs Abs: 0.2 10*3/uL — ABNORMAL LOW (ref 0.7–4.0)
MCH: 28.1 pg (ref 26.0–34.0)
MCHC: 33.5 g/dL (ref 30.0–36.0)
MCV: 83.9 fL (ref 80.0–100.0)
Monocytes Absolute: 0.2 10*3/uL (ref 0.1–1.0)
Monocytes Relative: 17 %
Neutro Abs: 0.5 10*3/uL — ABNORMAL LOW (ref 1.7–7.7)
Neutrophils Relative %: 57 %
Platelets: 19 10*3/uL — CL (ref 150–400)
RBC: 4.84 MIL/uL (ref 4.22–5.81)
RDW: 16.9 % — ABNORMAL HIGH (ref 11.5–15.5)
Smear Review: NORMAL
WBC: 0.9 10*3/uL — CL (ref 4.0–10.5)
nRBC: 0 % (ref 0.0–0.2)

## 2022-02-16 LAB — RESP PANEL BY RT-PCR (RSV, FLU A&B, COVID)  RVPGX2
Influenza A by PCR: NEGATIVE
Influenza B by PCR: NEGATIVE
Resp Syncytial Virus by PCR: NEGATIVE
SARS Coronavirus 2 by RT PCR: NEGATIVE

## 2022-02-16 LAB — RAPID HIV SCREEN (HIV 1/2 AB+AG)
HIV 1/2 Antibodies: NONREACTIVE
HIV-1 P24 Antigen - HIV24: NONREACTIVE

## 2022-02-16 LAB — TROPONIN I (HIGH SENSITIVITY)
Troponin I (High Sensitivity): 4 ng/L (ref <18)
Troponin I (High Sensitivity): 6 ng/L (ref <18)

## 2022-02-16 MED ORDER — METRONIDAZOLE 500 MG/100ML IV SOLN
500.0000 mg | Freq: Once | INTRAVENOUS | Status: AC
  Administered 2022-02-17: 500 mg via INTRAVENOUS
  Filled 2022-02-16: qty 100

## 2022-02-16 MED ORDER — VANCOMYCIN HCL IN DEXTROSE 1-5 GM/200ML-% IV SOLN
1000.0000 mg | Freq: Once | INTRAVENOUS | Status: AC
  Administered 2022-02-17: 1000 mg via INTRAVENOUS
  Filled 2022-02-16: qty 200

## 2022-02-16 MED ORDER — ACETAMINOPHEN 500 MG PO TABS
1000.0000 mg | ORAL_TABLET | Freq: Once | ORAL | Status: AC
  Administered 2022-02-16: 1000 mg via ORAL
  Filled 2022-02-16: qty 2

## 2022-02-16 MED ORDER — IOHEXOL 350 MG/ML SOLN
80.0000 mL | Freq: Once | INTRAVENOUS | Status: AC | PRN
  Administered 2022-02-16: 80 mL via INTRAVENOUS

## 2022-02-16 MED ORDER — SODIUM CHLORIDE 0.9 % IV SOLN
2.0000 g | Freq: Once | INTRAVENOUS | Status: AC
  Administered 2022-02-17: 2 g via INTRAVENOUS
  Filled 2022-02-16: qty 12.5

## 2022-02-16 NOTE — ED Triage Notes (Signed)
Pt comes with c/o increased fever, chest pressure and fatigue. Pt has been evaluated at [facility] and by [facility] with all tests being neg.  Pt states pressure in chest.

## 2022-02-16 NOTE — ED Provider Notes (Incomplete)
[facility] Provider Note    Event Date/Time   First MD Initiated Contact with Patient 02/16/22 1950     (approximate)   History   Chest Pain   HPI  Eugene Cameron is a 66 y.o. male who presents to the emergency department at the advice of his primary care doctor's office because of persistent fever and fatigue.  The patient states that 3 days ago he developed chills.  Also had a slight cough.  Fever at home was 102.  He has had decreased appetite during this time.  Went to urgent care and tested negative for flu and COVID.  Additionally had some back pain and has had kidney stones in the past although went to his urologist and they did not find any evidence of kidney stones.      Physical Exam   Triage Vital Signs: ED Triage Vitals  Enc Vitals Group     BP 02/16/22 1738 122/67     Pulse Rate 02/16/22 1738 100     Resp 02/16/22 1738 17     Temp 02/16/22 1738 (!) 100.8 F (38.2 C)     Temp src --      SpO2 02/16/22 1738 100 %     Weight --      Height --      Head Circumference --      Peak Flow --      Pain Score 02/16/22 1738 6     Pain Loc --      Pain Edu? --      Excl. in GC? --     Most recent vital signs: Vitals:   02/16/22 1738 02/16/22 1957  BP: 122/67 133/67  Pulse: 100 91  Resp: 17 16  Temp: (!) 100.8 F (38.2 C)   SpO2: 100% 98%   General: Awake, alert, oriented. CV:  Good peripheral perfusion. Regular rate and rhythm. Resp:  Normal effort. Lungs clear. Abd:  No distention.    ED Results / Procedures / Treatments   Labs (all labs ordered are listed, but only abnormal results are displayed) Labs Reviewed  BASIC METABOLIC PANEL - Abnormal; Notable for the following components:      Result Value   Sodium 134 (*)    Glucose, Bld 206 (*)    Creatinine, Ser 1.40 (*)    Calcium 8.6 (*)    GFR, Estimated 56 (*)    All other components within normal limits  RESP PANEL BY RT-PCR (RSV, FLU A&B, COVID)  RVPGX2  RAPID  HIV SCREEN (HIV 1/2 AB+AG)  CBC  HEPATITIS B SURFACE ANTIGEN  HCV AB W REFLEX TO QUANT PCR  TROPONIN I (HIGH SENSITIVITY)  TROPONIN I (HIGH SENSITIVITY)     EKG  I, Phineas Semen, attending physician, personally viewed and interpreted this EKG  EKG Time: 1740 Rate: 87 Rhythm: normal sinus rhythm Axis: normal Intervals: qtc 413 QRS: narrow ST changes: no st elevation Impression: normal ekg   RADIOLOGY I independently interpreted and visualized the CXR. My interpretation: No pneumonia Radiology interpretation:  IMPRESSION:  Left lower lung scarring versus atelectasis.      PROCEDURES:  Critical Care performed: No  Procedures   MEDICATIONS ORDERED IN ED: Medications - No data to display   IMPRESSION / MDM / ASSESSMENT AND PLAN / ED COURSE  I reviewed the triage vital signs and the nursing notes.  Differential diagnosis includes, but is not limited to, COVID, influenza, pneumonia, UTI, bacteremia.  Patient's presentation is most consistent with acute presentation with potential threat to life or bodily function.  Patient presents to the emergency department today because of concerns for fevers and fatigue.  Patient does have history of thrombocytopenia and leukopenia.  Blood work today shows white blood cell count of 0.9.  ANC of 1.5.  Was febrile here.  We will plan on starting broad-spectrum antibiotics.  No clear source at the time of admission.  UA without signs of infection.  COVID flu and chest x-ray negative.  Discussed with Dr. Marland Kitchen with the hospitalist service who will plan on admission.  FINAL CLINICAL IMPRESSION(S) / ED DIAGNOSES   Final diagnoses:  Fever, unspecified fever cause  Leukopenia, unspecified type     Note:  This document was prepared using Dragon voice recognition software and may include unintentional dictation errors.

## 2022-02-16 NOTE — ED Provider Notes (Signed)
[facility] Provider Note    Event Date/Time   First MD Initiated Contact with Patient 02/16/22 1950     (approximate)   History   Chest Pain   HPI  Eugene Cameron is a 66 y.o. male who presents to the emergency department at the advice of his primary care doctor's office because of persistent fever and fatigue.  The patient states that 3 days ago he developed chills.  Also had a slight cough.  Fever at home was 102.  He has had decreased appetite during this time.  Went to urgent care and tested negative for flu and COVID.  Additionally had some back pain and has had kidney stones in the past although went to his urologist and they did not find any evidence of kidney stones.   Physical Exam   Triage Vital Signs: ED Triage Vitals  Enc Vitals Group     BP 02/16/22 1738 122/67     Pulse Rate 02/16/22 1738 100     Resp 02/16/22 1738 17     Temp 02/16/22 1738 (!) 100.8 F (38.2 C)     Temp src --      SpO2 02/16/22 1738 100 %     Weight --      Height --      Head Circumference --      Peak Flow --      Pain Score 02/16/22 1738 6     Pain Loc --      Pain Edu? --      Excl. in GC? --     Most recent vital signs: Vitals:   02/16/22 1738 02/16/22 1957  BP: 122/67 133/67  Pulse: 100 91  Resp: 17 16  Temp: (!) 100.8 F (38.2 C)   SpO2: 100% 98%   General: Awake, alert, oriented. CV:  Good peripheral perfusion. Regular rate and rhythm. Resp:  Normal effort. Lungs clear. Abd:  No distention.    ED Results / Procedures / Treatments   Labs (all labs ordered are listed, but only abnormal results are displayed) Labs Reviewed  BLOOD CULTURE ID PANEL (REFLEXED) - BCID2 - Abnormal; Notable for the following components:      Result Value   Streptococcus species DETECTED (*)    All other components within normal limits  BASIC METABOLIC PANEL - Abnormal; Notable for the following components:   Sodium 134 (*)    Glucose, Bld 206 (*)     Creatinine, Ser 1.40 (*)    Calcium 8.6 (*)    GFR, Estimated 56 (*)    All other components within normal limits  CBC - Abnormal; Notable for the following components:   WBC 0.9 (*)    RDW 16.8 (*)    Platelets 19 (*)    All other components within normal limits  CBC WITH DIFFERENTIAL/PLATELET - Abnormal; Notable for the following components:   WBC 0.9 (*)    RDW 16.9 (*)    Platelets 19 (*)    Neutro Abs 0.5 (*)    Lymphs Abs 0.2 (*)    All other components within normal limits  CBC - Abnormal; Notable for the following components:   WBC 0.8 (*)    RBC 4.01 (*)    Hemoglobin 11.3 (*)    HCT 33.1 (*)    RDW 16.4 (*)    Platelets 18 (*)    All other components within normal limits  BASIC METABOLIC PANEL - Abnormal; Notable for the  following components:   CO2 20 (*)    Glucose, Bld 231 (*)    Calcium 7.9 (*)    All other components within normal limits  HEMOGLOBIN A1C - Abnormal; Notable for the following components:   Hgb A1c MFr Bld 8.3 (*)    All other components within normal limits  GLUCOSE, CAPILLARY - Abnormal; Notable for the following components:   Glucose-Capillary 249 (*)    All other components within normal limits  GLUCOSE, CAPILLARY - Abnormal; Notable for the following components:   Glucose-Capillary 198 (*)    All other components within normal limits  GLUCOSE, CAPILLARY - Abnormal; Notable for the following components:   Glucose-Capillary 137 (*)    All other components within normal limits  RESP PANEL BY RT-PCR (RSV, FLU A&B, COVID)  RVPGX2  CULTURE, BLOOD (ROUTINE X 2)  CULTURE, BLOOD (ROUTINE X 2)  MRSA NEXT GEN BY PCR, NASAL  RESPIRATORY PANEL BY PCR  RAPID HIV SCREEN (HIV 1/2 AB+AG)  HEPATITIS B SURFACE ANTIGEN  PROCALCITONIN  HCV AB W REFLEX TO QUANT PCR  TROPONIN I (HIGH SENSITIVITY)  TROPONIN I (HIGH SENSITIVITY)     EKG  I, Nance Pear, attending physician, personally viewed and interpreted this EKG  EKG Time: 1740 Rate:  87 Rhythm: normal sinus rhythm Axis: normal Intervals: qtc 413 QRS: narrow ST changes: no st elevation Impression: normal ekg   RADIOLOGY I independently interpreted and visualized the CXR. My interpretation: No pneumonia Radiology interpretation:  IMPRESSION:  Left lower lung scarring versus atelectasis.      PROCEDURES:  Critical Care performed: No  Procedures   MEDICATIONS ORDERED IN ED: Medications - No data to display   IMPRESSION / MDM / [place] / ED COURSE  I reviewed the triage vital signs and the nursing notes.                              Differential diagnosis includes, but is not limited to, COVID, influenza, pneumonia, UTI, bacteremia.  Patient's presentation is most consistent with acute presentation with potential threat to life or bodily function.  Patient presents to the emergency department today because of concerns for fevers and fatigue.  Patient does have history of thrombocytopenia and leukopenia.  Blood work today shows white blood cell count of 0.9.  ANC of 1.5.  Was febrile here.  We will plan on starting broad-spectrum antibiotics.  No clear source at the time of admission.  UA without signs of infection.  COVID flu and chest x-ray negative.  Discussed with Dr. Damita Dunnings with the hospitalist service who will plan on admission.  FINAL CLINICAL IMPRESSION(S) / ED DIAGNOSES   Final diagnoses:  Fever, unspecified fever cause  Leukopenia, unspecified type     Note:  This document was prepared using Dragon voice recognition software and may include unintentional dictation errors.    Nance Pear, MD 02/17/22 [phone]

## 2022-02-16 NOTE — ED Provider Triage Note (Signed)
Emergency Medicine Provider Triage Evaluation Note  Eugene Cameron , a 66 y.o. male  was evaluated in triage.  Pt complains of fever, chills, chest pain, some shortness of breath.  Review of Systems  Positive:  Negative:   Physical Exam  There were no vitals taken for this visit. Gen:   Awake, no distress   Resp:  Normal effort  MSK:   Moves extremities without difficulty  Other:    Medical Decision Making  Medically screening exam initiated at 5:34 PM.  Appropriate orders placed.  Eugene Cameron was informed that the remainder of the evaluation will be completed by another provider, this initial triage assessment does not replace that evaluation, and the importance of remaining in the ED until their evaluation is complete.     Faythe Ghee, PA-C 02/16/22 1734

## 2022-02-17 ENCOUNTER — Encounter: Payer: Self-pay | Admitting: Internal Medicine

## 2022-02-17 DIAGNOSIS — D709 Neutropenia, unspecified: Secondary | ICD-10-CM | POA: Diagnosis not present

## 2022-02-17 DIAGNOSIS — D72819 Decreased white blood cell count, unspecified: Secondary | ICD-10-CM | POA: Insufficient documentation

## 2022-02-17 DIAGNOSIS — R5081 Fever presenting with conditions classified elsewhere: Secondary | ICD-10-CM

## 2022-02-17 DIAGNOSIS — I8500 Esophageal varices without bleeding: Secondary | ICD-10-CM | POA: Insufficient documentation

## 2022-02-17 DIAGNOSIS — R6889 Other general symptoms and signs: Secondary | ICD-10-CM | POA: Diagnosis present

## 2022-02-17 LAB — RESPIRATORY PANEL BY PCR

## 2022-02-17 LAB — BLOOD CULTURE ID PANEL (REFLEXED) - BCID2

## 2022-02-17 LAB — GLUCOSE, CAPILLARY
Glucose-Capillary: 249 mg/dL — ABNORMAL HIGH (ref 70–99)
Glucose-Capillary: 198 mg/dL — ABNORMAL HIGH (ref 70–99)
Glucose-Capillary: 137 mg/dL — ABNORMAL HIGH (ref 70–99)
Glucose-Capillary: 136 mg/dL — ABNORMAL HIGH (ref 70–99)
Glucose-Capillary: 150 mg/dL — ABNORMAL HIGH (ref 70–99)

## 2022-02-17 LAB — BASIC METABOLIC PANEL
Anion gap: 5 (ref 5–15)
BUN: 18 mg/dL (ref 8–23)
CO2: 20 mmol/L — ABNORMAL LOW (ref 22–32)
Calcium: 7.9 mg/dL — ABNORMAL LOW (ref 8.9–10.3)
Chloride: 110 mmol/L (ref 98–111)
Creatinine, Ser: 1.19 mg/dL (ref 0.61–1.24)
GFR, Estimated: >60 mL/min (ref >60)
Glucose, Bld: 231 mg/dL — ABNORMAL HIGH (ref 70–99)
Potassium: 3.7 mmol/L (ref 3.5–5.1)
Sodium: 135 mmol/L (ref 135–145)

## 2022-02-17 LAB — CBC
HCT: 33.1 % — ABNORMAL LOW (ref 39.0–52.0)
Hemoglobin: 11.3 g/dL — ABNORMAL LOW (ref 13.0–17.0)
MCH: 28.2 pg (ref 26.0–34.0)
MCHC: 34.1 g/dL (ref 30.0–36.0)
MCV: 82.5 fL (ref 80.0–100.0)
Platelets: 18 10*3/uL — CL (ref 150–400)
RBC: 4.01 MIL/uL — ABNORMAL LOW (ref 4.22–5.81)
RDW: 16.4 % — ABNORMAL HIGH (ref 11.5–15.5)
WBC: 0.8 10*3/uL — CL (ref 4.0–10.5)
nRBC: 0 % (ref 0.0–0.2)

## 2022-02-17 LAB — HEPATITIS B SURFACE ANTIGEN: Hepatitis B Surface Ag: NONREACTIVE

## 2022-02-17 LAB — HEMOGLOBIN A1C
Hgb A1c MFr Bld: 8.3 % — ABNORMAL HIGH (ref 4.8–5.6)
Mean Plasma Glucose: 191.51 mg/dL

## 2022-02-17 LAB — CULTURE, URINE COMPREHENSIVE

## 2022-02-17 LAB — PROCALCITONIN: Procalcitonin: 0.49 ng/mL

## 2022-02-17 LAB — MRSA NEXT GEN BY PCR, NASAL: MRSA by PCR Next Gen: NOT DETECTED

## 2022-02-17 MED ORDER — AVATROMBOPAG MALEATE 20 MG PO TABS: 20.0000 mg | ORAL_TABLET | Freq: Every day | ORAL | Status: DC

## 2022-02-17 MED ORDER — METRONIDAZOLE 500 MG/100ML IV SOLN
500.0000 mg | Freq: Two times a day (BID) | INTRAVENOUS | Status: DC
  Filled 2022-02-17: qty 100

## 2022-02-17 MED ORDER — VANCOMYCIN HCL IN DEXTROSE 1-5 GM/200ML-% IV SOLN
1000.0000 mg | Freq: Two times a day (BID) | INTRAVENOUS | Status: DC
  Administered 2022-02-17: 1000 mg via INTRAVENOUS
  Filled 2022-02-17: qty 200

## 2022-02-17 MED ORDER — INSULIN ASPART 100 UNIT/ML IJ SOLN
0.0000 [IU] | Freq: Three times a day (TID) | INTRAMUSCULAR | Status: DC
  Administered 2022-02-17: 2 [IU] via SUBCUTANEOUS
  Administered 2022-02-17: 3 [IU] via SUBCUTANEOUS
  Administered 2022-02-17: 2 [IU] via SUBCUTANEOUS
  Administered 2022-02-19: 5 [IU] via SUBCUTANEOUS
  Administered 2022-02-19: 2 [IU] via SUBCUTANEOUS
  Administered 2022-02-19: 3 [IU] via SUBCUTANEOUS
  Filled 2022-02-17 (×6): qty 1

## 2022-02-17 MED ORDER — OSELTAMIVIR PHOSPHATE 75 MG PO CAPS
75.0000 mg | ORAL_CAPSULE | Freq: Two times a day (BID) | ORAL | Status: DC
  Filled 2022-02-17: qty 1

## 2022-02-17 MED ORDER — SODIUM CHLORIDE 0.9 % IV SOLN
2.0000 g | Freq: Three times a day (TID) | INTRAVENOUS | Status: DC
  Administered 2022-02-17 (×2): 2 g via INTRAVENOUS
  Filled 2022-02-17 (×2): qty 12.5
  Filled 2022-02-17: qty 2

## 2022-02-17 MED ORDER — SODIUM CHLORIDE 0.9 % IV SOLN: INTRAVENOUS | Status: DC | PRN

## 2022-02-17 MED ORDER — ONDANSETRON HCL 4 MG PO TABS: 4.0000 mg | ORAL_TABLET | Freq: Four times a day (QID) | ORAL | Status: DC | PRN

## 2022-02-17 MED ORDER — GUAIFENESIN-DM 100-10 MG/5ML PO SYRP: 10.0000 mL | ORAL_SOLUTION | ORAL | Status: DC | PRN

## 2022-02-17 MED ORDER — ACETAMINOPHEN 325 MG PO TABS
650.0000 mg | ORAL_TABLET | Freq: Four times a day (QID) | ORAL | Status: DC | PRN
  Administered 2022-02-17: 650 mg via ORAL

## 2022-02-17 MED ORDER — INSULIN GLARGINE-YFGN 100 UNIT/ML ~~LOC~~ SOLN
50.0000 [IU] | Freq: Every day | SUBCUTANEOUS | Status: DC
  Administered 2022-02-17 – 2022-02-18 (×3): 50 [IU] via SUBCUTANEOUS
  Filled 2022-02-17 (×5): qty 0.5

## 2022-02-17 MED ORDER — ACETAMINOPHEN 650 MG RE SUPP: 650.0000 mg | Freq: Four times a day (QID) | RECTAL | Status: DC | PRN

## 2022-02-17 MED ORDER — ALBUTEROL SULFATE (2.5 MG/3ML) 0.083% IN NEBU: 2.5000 mg | INHALATION_SOLUTION | RESPIRATORY_TRACT | Status: DC | PRN

## 2022-02-17 MED ORDER — OSELTAMIVIR PHOSPHATE 75 MG PO CAPS
75.0000 mg | ORAL_CAPSULE | Freq: Two times a day (BID) | ORAL | Status: DC
  Administered 2022-02-17: 75 mg via ORAL
  Filled 2022-02-17: qty 1

## 2022-02-17 MED ORDER — ONDANSETRON HCL 4 MG/2ML IJ SOLN: 4.0000 mg | Freq: Four times a day (QID) | INTRAMUSCULAR | Status: DC | PRN

## 2022-02-17 MED ORDER — SODIUM CHLORIDE 0.9 % IV BOLUS (SEPSIS)
1000.0000 mL | Freq: Once | INTRAVENOUS | Status: AC
  Administered 2022-02-17: 1000 mL via INTRAVENOUS

## 2022-02-17 MED ORDER — SODIUM CHLORIDE 0.9 % IV SOLN
2.0000 g | INTRAVENOUS | Status: DC
  Administered 2022-02-18 – 2022-02-19 (×2): 2 g via INTRAVENOUS
  Filled 2022-02-17 (×2): qty 20

## 2022-02-17 MED ORDER — INSULIN ASPART 100 UNIT/ML IJ SOLN
0.0000 [IU] | Freq: Every day | INTRAMUSCULAR | Status: DC
  Administered 2022-02-18: 2 [IU] via SUBCUTANEOUS
  Filled 2022-02-17: qty 1

## 2022-02-17 MED ORDER — CARVEDILOL 6.25 MG PO TABS
6.2500 mg | ORAL_TABLET | Freq: Two times a day (BID) | ORAL | Status: DC
  Administered 2022-02-17 – 2022-02-18 (×3): 6.25 mg via ORAL
  Filled 2022-02-17 (×4): qty 1

## 2022-02-17 MED ORDER — FUROSEMIDE 20 MG PO TABS
20.0000 mg | ORAL_TABLET | Freq: Every day | ORAL | Status: DC
  Administered 2022-02-17 – 2022-02-19 (×3): 20 mg via ORAL
  Filled 2022-02-17 (×3): qty 1

## 2022-02-17 MED ORDER — LACTATED RINGERS IV SOLN: INTRAVENOUS | Status: AC

## 2022-02-17 NOTE — Assessment & Plan Note (Addendum)
Patient presents with body ache, malaise, fever COVID flu and RSV negative We will get respiratory viral panel-pending results Supportive care

## 2022-02-17 NOTE — H&P (Signed)
History and Physical    Patient: Eugene Cameron FMB:[id number] DOB: 1956-02-11 DOA: 02/16/2022 DOS: the patient was seen and examined on 02/17/2022 PCP: Eugene Ley, MD  Patient coming from: Home  Chief Complaint:  Chief Complaint  Patient presents with   Chest Pain    HPI: Eugene Cameron is a 66 y.o. male with medical history significant for Type 2 diabetes, celiac disease, Karlene Cameron cirrhosis since 7017 complicated by esophageal varices and portal hypertensive gastropathy as well as chronic thrombocytopenia and leukopenia with negative hematology work-up in 2019, who presents to the ED from urgent care with a 2 day history of flulike symptoms of fever, chills, body aches, and nausea.  He denies cough or shortness of breath or chest pain.has nausea but denies vomiting, diarrhea or abdominal pain.  Denies headache visual disturbance or neck pain.  He was empirically started on Tamiflu on 11/15. ED course and data review: Tmax 103 with pulse of 100 respirations up to 23 with O2 sat mid to high 90s on room air.  BP 122/67.  Labs significant for WBC 0.9, down from baseline of 1.8 on 10/26 and platelets 19,000, down from 24,000 on same date.  Creatinine at baseline at 1.4.  Glucose 206.  COVID and flu negative, HIV nonreactive, HCV and hepatitis B surface antigen in process.  Troponin 6.  RSV was done at the urgent care and was reportedly negative.EKG, personally reviewed and interpreted shows NSR at 87 with no ischemic ST-T wave changes. CTA chest was unrevealing showing the following: IMPRESSION: No evidence of pulmonary embolus.   Bibasilar atelectasis.   Scattered coronary artery calcifications.   Cirrhosis with splenomegaly and upper abdominal varices, unchanged since recent abdominal CT.  Patient started on broad-spectrum antibiotics for neutropenic fever including cefepime, vancomycin and metronidazole and hospitalist consulted for admission.   Review of Systems: As mentioned in the  history of present illness. All other systems reviewed and are negative.  Past Medical History:  Diagnosis Date   Esophageal varices (Eugene Cameron)    s/p multiple bandings   Kidney stone    NASH (nonalcoholic steatohepatitis)    Spleen enlarged    Past Surgical History:  Procedure Laterality Date   ELBOW SURGERY Left    Social History:  reports that he has never smoked. He has never used smokeless tobacco. No history on file for alcohol use and drug use.  Allergies  Allergen Reactions   Gluten Meal     Other reaction(s): Other (See Comments) GI upset   Cortisone     Other reaction(s): Liver Disorder   Statins     Other reaction(s): Other (See Comments), Other (See Comments) Liver disease contraindication Liver disease contraindication     No family history on file.  Prior to Admission medications   Medication Sig Start Date End Date Taking? Authorizing Provider  ACCU-CHEK GUIDE test strip in the morning, at noon, and at bedtime. 07/12/20  Yes [provider]  BD PEN NEEDLE NANO 2ND GEN 32G X 4 MM MISC daily. 06/23/20  Yes [provider]  carvedilol (COREG) 6.25 MG tablet Take 1/2 tablet in morning, 1 tablet in evening 05/02/20  Yes [provider]  DOPTELET 20 MG TABS Take by mouth. 02/13/22  Yes [provider]  FARXIGA 5 MG TABS tablet Take 5 mg by mouth daily. 07/28/20  Yes [provider]  fluticasone (FLONASE) 50 MCG/ACT nasal spray Place 2 sprays into the nose as needed. 07/13/19  Yes [provider]  furosemide (  LASIX) 20 MG tablet Take 20 mg by mouth daily. 06/10/20  Yes [provider]  gabapentin (NEURONTIN) 100 MG capsule Take 300 mg by mouth at bedtime. 05/02/20  Yes [provider]  glipiZIDE (GLUCOTROL) 10 MG tablet Take 10 mg by mouth 2 (two) times daily. 05/15/20  Yes [provider]  Lactobacillus Rhamnosus, GG, (RA PROBIOTIC DIGESTIVE CARE) CAPS Take 1 capsule by mouth daily.   Yes [provider]  ondansetron (ZOFRAN-ODT) 4 MG disintegrating tablet Take 1 tablet (4 mg total) by mouth every 8 (eight) hours as needed for nausea or vomiting. 03/17/21  Yes Vaillancourt, Aldona Bar, PA-C  oseltamivir (TAMIFLU) 75 MG capsule Take 1 capsule (75 mg total) by mouth every 12 (twelve) hours. 02/14/22  Yes Immordino, Annie Main, FNP  pantoprazole (PROTONIX) 40 MG tablet Take 1 tablet by mouth daily. 07/21/20  Yes [provider]  pioglitazone (ACTOS) 30 MG tablet Take 30 mg by mouth daily. 05/15/20  Yes [provider]  tamsulosin (FLOMAX) 0.4 MG CAPS capsule Take 1 tablet by mouth daily until you pass the kidney stone or no longer have symptoms 03/17/21  Yes Vaillancourt, Samantha, PA-C  TRESIBA FLEXTOUCH 100 UNIT/ML FlexTouch Pen Inject 50 Units into the skin at bedtime. 07/11/20  Yes [provider]  cephALEXin (KEFLEX) 500 MG capsule Take 1 capsule (500 mg total) by mouth 2 (two) times daily. Patient not taking: Reported on 02/14/2022 06/04/21   Delman Kitten, MD  Continuous Blood Gluc Sensor (DEXCOM G7 SENSOR) MISC USE 1 EACH EVERY 10 (TEN) DAYS 09/01/21   [provider]  docusate sodium (COLACE) 100 MG capsule Take 1 tablet once or twice daily as needed for constipation while taking narcotic pain medicine Patient not taking: Reported on 04/18/2021 07/27/20   Eugene Kehr, MD  metFORMIN (GLUCOPHAGE) 500 MG tablet Take by mouth.    [provider]  oxyCODONE (ROXICODONE) 5 MG immediate release tablet Take 1-2 tablets (5-10 mg total) by mouth every 6 (six) hours as needed for severe pain. Patient not taking: Reported on 02/14/2022 03/17/21   Eugene Loop, PA-C    Physical Exam: Vitals:   02/16/22 2200 02/16/22 2230 02/16/22 2300 02/16/22 2330  BP: (!) 123/59 (!) 114/59 117/62 122/60  Pulse: 92 92 84 86  Resp: (!) 24 20 (!) 23 15  Temp:  99.6 F (37.6 C)    TempSrc:  Oral    SpO2: 95% 94% 94% 96%   Physical Exam Vitals and nursing  note reviewed.  Constitutional:      General: He is not in acute distress. HENT:     Head: Normocephalic and atraumatic.  Cardiovascular:     Rate and Rhythm: Normal rate and regular rhythm.     Heart sounds: Normal heart sounds.  Pulmonary:     Effort: Pulmonary effort is normal.     Breath sounds: Normal breath sounds.  Abdominal:     Palpations: Abdomen is soft.     Tenderness: There is no abdominal tenderness.  Neurological:     Mental Status: Mental status is at baseline.     Labs on Admission: I have personally reviewed following labs and imaging studies  CBC: Recent Labs  Lab 02/16/22 1847  WBC 0.9*  0.9*  NEUTROABS 0.5*  HGB 13.6  13.6  HCT 40.6  40.5  MCV 83.9  83.7  PLT 19*  19*   Basic Metabolic Panel: Recent Labs  Lab 02/16/22 1739  NA 134*  K 4.3  CL 105  CO2 22  GLUCOSE 206*  BUN 21  CREATININE 1.40*  CALCIUM 8.6*   GFR: CrCl cannot be calculated (Unknown ideal weight.). Liver Function Tests: No results for input(s): "AST", "ALT", "ALKPHOS", "BILITOT", "PROT", "ALBUMIN" in the last 168 hours. No results for input(s): "LIPASE", "AMYLASE" in the last 168 hours. No results for input(s): "AMMONIA" in the last 168 hours. Coagulation Profile: No results for input(s): "INR", "PROTIME" in the last 168 hours. Cardiac Enzymes: No results for input(s): "CKTOTAL", "CKMB", "CKMBINDEX", "TROPONINI" in the last 168 hours. BNP (last 3 results) No results for input(s): "PROBNP" in the last 8760 hours. HbA1C: No results for input(s): "HGBA1C" in the last 72 hours. CBG: No results for input(s): "GLUCAP" in the last 168 hours. Lipid Profile: No results for input(s): "CHOL", "HDL", "LDLCALC", "TRIG", "CHOLHDL", "LDLDIRECT" in the last 72 hours. Thyroid Function Tests: No results for input(s): "TSH", "T4TOTAL", "FREET4", "T3FREE", "THYROIDAB" in the last 72 hours. Anemia Panel: No results for input(s): "VITAMINB12", "FOLATE", "FERRITIN", "TIBC", "IRON",  "RETICCTPCT" in the last 72 hours. Urine analysis:    Component Value Date/Time   COLORURINE YELLOW 08/02/2020 0949   APPEARANCEUR Clear 02/14/2022 1528   LABSPEC 1.010 08/02/2020 0949   PHURINE 5.5 08/02/2020 0949   GLUCOSEU CANCELED 02/14/2022 1528   HGBUR NEGATIVE 08/02/2020 0949   BILIRUBINUR Negative 04/18/2021 0912   KETONESUR NEGATIVE 08/02/2020 0949   PROTEINUR CANCELED 02/14/2022 [place] 08/02/2020 0949   NITRITE Negative 04/18/2021 0912   NITRITE NEGATIVE 08/02/2020 0949   LEUKOCYTESUR Negative 04/18/2021 0912   LEUKOCYTESUR NEGATIVE 08/02/2020 0949    Radiological Exams on Admission: CT Angio Chest PE W and/or Wo Contrast  Result Date: 02/16/2022 CLINICAL DATA:  Shortness of breath, fever EXAM: CT ANGIOGRAPHY CHEST WITH CONTRAST TECHNIQUE: Multidetector CT imaging of the chest was performed using the standard protocol during bolus administration of intravenous contrast. Multiplanar CT image reconstructions and MIPs were obtained to evaluate the vascular anatomy. RADIATION DOSE REDUCTION: This exam was performed according to the departmental dose-optimization program which includes automated exposure control, adjustment of the mA and/or kV according to patient size and/or use of iterative reconstruction technique. CONTRAST:  98m OMNIPAQUE IOHEXOL 350 MG/ML SOLN COMPARISON:  Abdominal CT 02/14/2022 FINDINGS: Cardiovascular: No filling defects in the pulmonary arteries to suggest pulmonary emboli. Cardiomegaly. Scattered coronary artery and aortic calcifications. No evidence of aortic aneurysm or dissection. Mediastinum/Nodes: No mediastinal, hilar, or axillary adenopathy. Trachea and esophagus are unremarkable. Thyroid unremarkable. Lungs/Pleura: Linear atelectasis in the lung bases.  No effusions. Upper Abdomen: Splenomegaly as seen on recent abdominal CT. Changes of cirrhosis. Upper abdominal varices noted. Findings unchanged since recent abdominal CT.  Musculoskeletal: Chest wall soft tissues are unremarkable. No acute bony abnormality. Review of the MIP images confirms the above findings. IMPRESSION: No evidence of pulmonary embolus. Bibasilar atelectasis. Scattered coronary artery calcifications. Cirrhosis with splenomegaly and upper abdominal varices, unchanged since recent abdominal CT. Aortic Atherosclerosis (ICD10-I70.0). Electronically Signed   By: KRolm BaptiseM.D.   On: 02/16/2022 21:26   DG Chest 2 View  Result Date: 02/16/2022 CLINICAL DATA:  Chest pain.  Shortness of breath.  Fever and chills. EXAM: CHEST - 2 VIEW COMPARISON:  None Available. FINDINGS: The heart size and mediastinal contours are within normal limits. Azygous fissure is incidentally noted. Bandlike opacities are seen in the left lower lung which may be due to scarring or atelectasis. No evidence of pulmonary consolidation or edema. No evidence of pleural effusion. IMPRESSION: Left lower lung scarring versus  atelectasis. Electronically Signed   By: Marlaine Hind M.D.   On: 02/16/2022 18:04     Data Reviewed: Relevant notes from primary care and specialist visits, past discharge summaries as available in EHR, including Care Everywhere. Prior diagnostic testing as pertinent to current admission diagnoses Updated medications and problem lists for reconciliation ED course, including vitals, labs, imaging, treatment and response to treatment Triage notes, nursing and pharmacy notes and ED provider's notes Notable results as noted in HPI   Assessment and Plan: * Neutropenic fever ([place]) Acute on chronic leukopenia WBC 900 down from 1800 on 10/26 for an [place] of 500 Patient has longstanding chronic leukopenia s/p hematology work-up with bone marrow biopsy in 2019 Acute worsening possibly related to marrow suppression from viral illness We will treat as neutropenic fever Continue broad-spectrum antibiotics with vancomycin, cefepime and Flagyl Follow blood cultures So far  COVID and flu negative.  RSV done at urgent care was negative.  Stat HIV negative Consider ID consult  Flu-like symptoms Patient presents with body ache, malaise, fever COVID flu and RSV negative We will get respiratory viral panel Follow blood cultures Supportive care  Acute on chronic thrombocytopenia (Eugene Cameron) Splenomegaly Baseline platelet count 24-25,000, now down to 19,000 Chart review reveals extensive hematology work-up 2019 including bone marrow bone biopsy Thrombocytopenia believed related to liver disease and splenomegaly Takes Doptelet prior to procedures usually with improvement to the 30,000s No active bleeding Serial H&H We will consider a unit of platelets if downtrending SCDs for DVT prophylaxis  Liver cirrhosis secondary to NASH (nonalcoholic steatohepatitis) ([place]) Splenomegaly/portal hypertensive gastropathy/esophageal varices/splenomegaly Followed by [facility] gastroenterology, last seen in October Last EGD was January 2023 s/p banding Continue Coreg  Stage 3a chronic kidney disease ([place]) Renal function at baseline  Uncontrolled type 2 diabetes mellitus with hyperglycemia, with long-term current use of insulin (Eugene Cameron) Blood sugar 206.   Sliding scale insulin coverage and basal insulin.  Hold oral home hypoglycemic agent        DVT prophylaxis: SCD  Consults: none  Advance Care Planning: full code  Family Communication: none  Disposition Plan: Back to previous home environment  Severity of Illness: The appropriate patient status for this patient is INPATIENT. Inpatient status is judged to be reasonable and necessary in order to provide the required intensity of service to ensure the patient's safety. The patient's presenting symptoms, physical exam findings, and initial radiographic and laboratory data in the context of their chronic comorbidities is felt to place them at high risk for further clinical deterioration. Furthermore, it is not anticipated that  the patient will be medically stable for discharge from the hospital within 2 midnights of admission.   * I certify that at the point of admission it is my clinical judgment that the patient will require inpatient hospital care spanning beyond 2 midnights from the point of admission due to high intensity of service, high risk for further deterioration and high frequency of surveillance required.*  Author: Athena Masse, MD 02/17/2022 12:23 AM  For on call review [email or web address].

## 2022-02-17 NOTE — Progress Notes (Signed)
Pharmacy Antibiotic Note  Eugene Cameron is a 66 y.o. male admitted on 02/16/2022 with infection of unknown source.  Pharmacy has been consulted for Cefepime & Vancomycin dosing x 7 days.  Plan: Cefepime 2gm q8hr per indication & renal fxn.  Pt given Vancomycin 1000 mg once. Vancomycin 1000 mg IV Q 12 hrs. Goal AUC 400-550. Expected AUC: 530.4 SCr used: 1.4  Pharmacy will continue to follow and will adjust abx dosing whenever warranted.  Temp (24hrs), Avg:101.1 F (38.4 C), Min:99.6 F (37.6 C), Max:103 F (39.4 C)   Recent Labs  Lab 02/16/22 1739 02/16/22 1847  WBC  --  0.9*  0.9*  CREATININE 1.40*  --     CrCl cannot be calculated (Unknown ideal weight.).    Allergies  Allergen Reactions   Gluten Meal     Other reaction(s): Other (See Comments) GI upset   Cortisone     Other reaction(s): Liver Disorder   Statins     Other reaction(s): Other (See Comments), Other (See Comments) Liver disease contraindication Liver disease contraindication     Antimicrobials this admission: 11/18 Cefepime >> x 7 days 11/18 Vancomycin >> x 7 days 11/18 Flagyl >> x 7 days  Microbiology results: 11/17 BCx: Pending  Thank you for allowing pharmacy to be a part of this patient's care.  Otelia Sergeant, PharmD, MBA 02/17/2022 1:05 AM

## 2022-02-17 NOTE — Assessment & Plan Note (Addendum)
Blood sugar 198>137.  A1c of 8.3 Sliding scale insulin coverage and basal insulin.  Hold oral home hypoglycemic agent

## 2022-02-17 NOTE — Assessment & Plan Note (Addendum)
Acute on chronic leukopenia WBC 900 down from 1800 on 10/26 for an [place] of 500 Patient has longstanding chronic leukopenia s/p hematology work-up with bone marrow biopsy in 2019, which remained inconclusive. Acute worsening possibly related to marrow suppression from viral illness We will treat as neutropenic fever. Blood cultures with Streptococcus species.  MRSA PCR negative, procalcitonin positive at 0.49.  Recent urine cultures negative. -Switch antibiotics with ceftriaxone -Follow-up final culture results -Follow-up respiratory viral panel

## 2022-02-17 NOTE — Assessment & Plan Note (Signed)
See above

## 2022-02-17 NOTE — Assessment & Plan Note (Addendum)
Splenomegaly Baseline platelet count 24-25,000, now down to 19,000>.18000 Chart review reveals extensive hematology work-up 2019 including bone marrow bone biopsy Thrombocytopenia believed related to liver disease and splenomegaly Takes Doptelet prior to procedures usually with improvement to the 30,000s No active bleeding Serial H&H We will consider a unit of platelets if downtrending SCDs for DVT prophylaxis

## 2022-02-17 NOTE — Progress Notes (Signed)
Progress Note   Patient: Eugene Cameron DOB: June 20, 1955 DOA: 02/16/2022     1 DOS: the patient was seen and examined on 02/17/2022   Brief hospital course: Taken from H&P.   Eugene Cameron is a 66 y.o. male with medical history significant for Type 2 diabetes, celiac disease, Karlene Lineman cirrhosis since 8115 complicated by esophageal varices and portal hypertensive gastropathy as well as chronic thrombocytopenia and leukopenia with negative hematology work-up in 2019, who presents to the ED from urgent care with a 2 day history of flulike symptoms of fever, chills, body aches, and nausea.  He denies cough or shortness of breath or chest pain.has nausea but denies vomiting, diarrhea or abdominal pain.  Denies headache visual disturbance or neck pain.  He was empirically started on Tamiflu on 11/15.   ED course.Tmax 103 with pulse of 100 respirations up to 23 with O2 sat mid to high 90s on room air.  BP 122/67.  Labs significant for WBC 0.9, down from baseline of 1.8 on 10/26 and platelets 19,000, down from 24,000 on same date.  Creatinine at baseline at 1.4.  Glucose 206.  COVID and flu negative, HIV nonreactive, HCV and hepatitis B surface antigen in process.  Troponin 6.  RSV was done at the urgent care and was reportedly negative.EKG, personally reviewed and interpreted shows NSR at 87 with no ischemic ST-T wave changes. CTA chest was unrevealing.  11/18: Febrile at 100.1, at 0.8, platelets at 18, slight decrease in all cell lines likely some dilutional effect.  Preliminary blood cultures with Streptococcus species.  Recent urine cultures negative.  Procalcitonin at 0.49.  MRSA PCR negative.  RVP pending Discontinue Tamiflu.  Patient was on cefepime, vancomycin and Flagyl-switching antibiotics with ceftriaxone only.      Assessment and Plan: * Neutropenic fever ([place]) Acute on chronic leukopenia WBC 900 down from 1800 on 10/26 for an [place] of 500 Patient has longstanding chronic  leukopenia s/p hematology work-up with bone marrow biopsy in 2019, which remained inconclusive. Acute worsening possibly related to marrow suppression from viral illness We will treat as neutropenic fever. Blood cultures with Streptococcus species.  MRSA PCR negative, procalcitonin positive at 0.49.  Recent urine cultures negative. -Switch antibiotics with ceftriaxone -Follow-up final culture results -Follow-up respiratory viral panel  Flu-like symptoms Patient presents with body ache, malaise, fever COVID flu and RSV negative We will get respiratory viral panel-pending results Supportive care  Acute on chronic thrombocytopenia (HCC) Splenomegaly Baseline platelet count 24-25,000, now down to 19,000>.18000 Chart review reveals extensive hematology work-up 2019 including bone marrow bone biopsy Thrombocytopenia believed related to liver disease and splenomegaly Takes Doptelet prior to procedures usually with improvement to the 30,000s No active bleeding Serial H&H We will consider a unit of platelets if downtrending SCDs for DVT prophylaxis  Splenomegaly -See above  Uncontrolled type 2 diabetes mellitus with hyperglycemia, with long-term current use of insulin (HCC) Blood sugar 198>137.  A1c of 8.3 Sliding scale insulin coverage and basal insulin.  Hold oral home hypoglycemic agent  Liver cirrhosis secondary to NASH (nonalcoholic steatohepatitis) ([place]) Splenomegaly/portal hypertensive gastropathy/esophageal varices/splenomegaly Followed by [facility] gastroenterology, last seen in October Last EGD was January 2023 s/p banding Continue Coreg  Stage 3a chronic kidney disease ([place]) Renal function at baseline -Monitor renal function -Avoid nephrotoxins    Subjective: Patient continued to have some fever, cough and body aches.  No abnormal bleeding.  Physical Exam: Vitals:   02/17/22 0203 02/17/22 0623 02/17/22 0740 02/17/22 1038  BP:  124/65 113/70 (!) 100/56 131/67  Pulse: 76  84 86 72  Resp: _0 Temp: 98.1 F (36.7 C) (!) 103 F (39.4 C) 100.1 F (37.8 C) 98.6 F (37 C)  TempSrc: Oral     SpO2: 100% 98% 96% 99%  Weight: 100.1 kg     Height: 6' (1.829 m)      General.  Well-developed gentleman, in no acute distress. Pulmonary.  Lungs clear bilaterally, normal respiratory effort. CV.  Regular rate and rhythm, no JVD, rub or murmur. Abdomen.  Soft, nontender, nondistended, BS positive. CNS.  Alert and oriented .  No focal neurologic deficit. Extremities.  No edema, no cyanosis, pulses intact and symmetrical. Psychiatry.  Judgment and insight appears normal.   Data Reviewed: Prior data reviewed  Family Communication: Discussed with wife at bedside  Disposition: Status is: Inpatient Remains inpatient appropriate because: Severity of illness  Planned Discharge Destination: Home  DVT prophylaxis.  SCDs Time spent:  minutes  This record has been created using Systems analyst. Errors have been sought and corrected,but may not always be located. Such creation errors do not reflect on the standard of care.   Author: Lorella Nimrod, MD 02/17/2022 3:40 PM  For on call review [email or web address].

## 2022-02-17 NOTE — Plan of Care (Signed)
  Problem: Fluid Volume: Goal: Hemodynamic stability will improve Outcome: Progressing   Problem: Respiratory: Goal: Ability to maintain adequate ventilation will improve Outcome: Progressing   Problem: Fluid Volume: Goal: Ability to maintain a balanced intake and output will improve Outcome: Progressing

## 2022-02-17 NOTE — Progress Notes (Signed)
PHARMACY - PHYSICIAN COMMUNICATION CRITICAL VALUE ALERT - BLOOD CULTURE IDENTIFICATION (BCID)  Eugene Cameron is an 66 y.o. male who presented to [facility] on 02/16/2022 with a chief complaint of fever in setting of neutropenia and flu like symptoms. Also has splenomegaly  Assessment: 4/4 bottles GPC. BCID detected Streptococcus species. Source not entirely clear at this time.   Name of physician (or Provider) Contacted: Dr. Nelson Chimes  Current antibiotics: Vancomycin + cefepime + metronidazole  Changes to prescribed antibiotics recommended:  Narrow to ceftriaxone  Results for orders placed or performed during the hospital encounter of 02/16/22  Blood Culture ID Panel (Reflexed) (Collected: 02/16/2022 11:59 PM)  Result Value Ref Range   Enterococcus faecalis NOT DETECTED NOT DETECTED   Enterococcus Faecium NOT DETECTED NOT DETECTED   Listeria monocytogenes NOT DETECTED NOT DETECTED   Staphylococcus species NOT DETECTED NOT DETECTED   Staphylococcus aureus (BCID) NOT DETECTED NOT DETECTED   Staphylococcus epidermidis NOT DETECTED NOT DETECTED   Staphylococcus lugdunensis NOT DETECTED NOT DETECTED   Streptococcus species DETECTED (A) NOT DETECTED   Streptococcus agalactiae NOT DETECTED NOT DETECTED   Streptococcus pneumoniae NOT DETECTED NOT DETECTED   Streptococcus pyogenes NOT DETECTED NOT DETECTED   A.calcoaceticus-baumannii NOT DETECTED NOT DETECTED   Bacteroides fragilis NOT DETECTED NOT DETECTED   Enterobacterales NOT DETECTED NOT DETECTED   Enterobacter cloacae complex NOT DETECTED NOT DETECTED   Escherichia coli NOT DETECTED NOT DETECTED   Klebsiella aerogenes NOT DETECTED NOT DETECTED   Klebsiella oxytoca NOT DETECTED NOT DETECTED   Klebsiella pneumoniae NOT DETECTED NOT DETECTED   Proteus species NOT DETECTED NOT DETECTED   Salmonella species NOT DETECTED NOT DETECTED   Serratia marcescens NOT DETECTED NOT DETECTED   Haemophilus influenzae NOT DETECTED NOT DETECTED    Neisseria meningitidis NOT DETECTED NOT DETECTED   Pseudomonas aeruginosa NOT DETECTED NOT DETECTED   Stenotrophomonas maltophilia NOT DETECTED NOT DETECTED   Candida albicans NOT DETECTED NOT DETECTED   Candida auris NOT DETECTED NOT DETECTED   Candida glabrata NOT DETECTED NOT DETECTED   Candida krusei NOT DETECTED NOT DETECTED   Candida parapsilosis NOT DETECTED NOT DETECTED   Candida tropicalis NOT DETECTED NOT DETECTED   Cryptococcus neoformans/gattii NOT DETECTED NOT DETECTED    [name] 02/17/2022  11:29 AM

## 2022-02-17 NOTE — Assessment & Plan Note (Addendum)
Splenomegaly/portal hypertensive gastropathy/esophageal varices/splenomegaly Followed by [facility] gastroenterology, last seen in October Last EGD was January 2023 s/p banding Continue Coreg

## 2022-02-17 NOTE — Progress Notes (Signed)
Notified Manuela Schwartz  NP of decrease in WBC and Platelets

## 2022-02-17 NOTE — Hospital Course (Addendum)
Taken from H&P.   LIEUTENANT ABARCA is a 66 y.o. male with medical history significant for Type 2 diabetes, celiac disease, Elita Bo[place] cirrhosis since 2012 complicated by esophageal varices and portal hypertensive gastropathy as well as chronic thrombocytopenia and leukopenia with negative hematology work-up in 2019, who presents to the ED from urgent care with a 2 day history of flulike symptoms of fever, chills, body aches, and nausea.  He denies cough or shortness of breath or chest pain.has nausea but denies vomiting, diarrhea or abdominal pain.  Denies headache visual disturbance or neck pain.  He was empirically started on Tamiflu on 11/15.   ED course.Tmax 103 with pulse of 100 respirations up to 23 with O2 sat mid to high 90s on room air.  BP 122/67.  Labs significant for WBC 0.9, down from baseline of 1.8 on 10/26 and platelets 19,000, down from 24,000 on same date.  Creatinine at baseline at 1.4.  Glucose 206.  COVID and flu negative, HIV nonreactive, HCV and hepatitis B surface antigen in process.  Troponin 6.  RSV was done at the urgent care and was reportedly negative.EKG, personally reviewed and interpreted shows NSR at 87 with no ischemic ST-T wave changes. CTA chest was unrevealing.  11/18: Febrile at 100.1, at 0.8, platelets at 18, slight decrease in all cell lines likely some dilutional effect.  Preliminary blood cultures with Streptococcus species.  Recent urine cultures negative.  Procalcitonin at 0.49.  MRSA PCR negative.  RVP pending Discontinue Tamiflu.  Patient was on cefepime, vancomycin and Flagyl-switching antibiotics with ceftriaxone only.  11/19: Afebrile this morning.  Slight improvement in WBCs to 1 and platelet decreased to 17. Still pending susceptibilities.  Repeating blood culture, also ordered echocardiogram. Giving 1 unit of platelet.  Continue ceftriaxone for now.  11/20: Remained afebrile, WBC with slight improvement 1.4, platelet remained 17 despite getting 1 unit of  platelet.  Repeat blood cultures negative in 24 hours.  Echocardiogram without any abnormality.  Patient is not a candidate for TEE due to thrombocytopenia. He wants to go home as he has his upcoming appointment with his hepatologist tomorrow.  Discussed with ID, Dr. Odette Fraction, who would like to continue IV antibiotics but okay to convert to levofloxacin for 2 weeks as he have to leave. Patient need to follow-up with infectious disease within next 2 weeks for further recommendations.  Discussed with patient and he agrees to follow-up with [facility] disease [facility] and [facility] as all of his care is over there.  Patient is being discharged on levofloxacin for 2 more weeks and need to have a close follow-up with his providers and infectious disease person for further recommendations.

## 2022-02-17 NOTE — Assessment & Plan Note (Addendum)
Renal function at baseline -Monitor renal function -Avoid nephrotoxins

## 2022-02-18 DIAGNOSIS — D709 Neutropenia, unspecified: Secondary | ICD-10-CM | POA: Diagnosis not present

## 2022-02-18 DIAGNOSIS — R5081 Fever presenting with conditions classified elsewhere: Secondary | ICD-10-CM | POA: Diagnosis not present

## 2022-02-18 LAB — HCV INTERPRETATION

## 2022-02-18 LAB — GLUCOSE, CAPILLARY
Glucose-Capillary: 81 mg/dL (ref 70–99)
Glucose-Capillary: 117 mg/dL — ABNORMAL HIGH (ref 70–99)
Glucose-Capillary: 148 mg/dL — ABNORMAL HIGH (ref 70–99)
Glucose-Capillary: 91 mg/dL (ref 70–99)
Glucose-Capillary: 205 mg/dL — ABNORMAL HIGH (ref 70–99)

## 2022-02-18 LAB — CREATININE, SERUM
Creatinine, Ser: 1.22 mg/dL (ref 0.61–1.24)
GFR, Estimated: >60 mL/min (ref >60)

## 2022-02-18 LAB — CBC
HCT: 33.6 % — ABNORMAL LOW (ref 39.0–52.0)
Hemoglobin: 11.5 g/dL — ABNORMAL LOW (ref 13.0–17.0)
MCH: 28.5 pg (ref 26.0–34.0)
MCHC: 34.2 g/dL (ref 30.0–36.0)
MCV: 83.4 fL (ref 80.0–100.0)
Platelets: 17 10*3/uL — CL (ref 150–400)
RBC: 4.03 MIL/uL — ABNORMAL LOW (ref 4.22–5.81)
RDW: 16.7 % — ABNORMAL HIGH (ref 11.5–15.5)
WBC: 1.1 10*3/uL — CL (ref 4.0–10.5)
nRBC: 0 % (ref 0.0–0.2)

## 2022-02-18 LAB — TYPE AND SCREEN
ABO/RH(D): O NEG
Antibody Screen: NEGATIVE

## 2022-02-18 LAB — ABO/RH: ABO/RH(D): O NEG

## 2022-02-18 LAB — HCV AB W REFLEX TO QUANT PCR: HCV Ab: NONREACTIVE

## 2022-02-18 LAB — PROCALCITONIN: Procalcitonin: 0.62 ng/mL

## 2022-02-18 MED ORDER — SODIUM CHLORIDE 0.9% IV SOLUTION: Freq: Once | INTRAVENOUS | Status: DC

## 2022-02-18 MED ORDER — MELATONIN 5 MG PO TABS
5.0000 mg | ORAL_TABLET | Freq: Once | ORAL | Status: AC
  Administered 2022-02-18: 5 mg via ORAL
  Filled 2022-02-18: qty 1

## 2022-02-18 NOTE — Plan of Care (Signed)
  Problem: Fluid Volume: Goal: Hemodynamic stability will improve Outcome: Progressing   Problem: Respiratory: Goal: Ability to maintain adequate ventilation will improve Outcome: Progressing   

## 2022-02-18 NOTE — Assessment & Plan Note (Signed)
Blood sugar within goal today.  A1c of 8.3 Sliding scale insulin coverage and basal insulin.  Hold oral home hypoglycemic agent

## 2022-02-18 NOTE — Progress Notes (Signed)
One unit of platelet is being transfused. Pt is kept under observation. No signs and symptoms of allergy or reaction.

## 2022-02-18 NOTE — Assessment & Plan Note (Signed)
Splenomegaly Baseline platelet count 24-25,000, now down to 19,000>.18000.17000 Chart review reveals extensive hematology work-up 2019 including bone marrow bone biopsy Thrombocytopenia believed related to liver disease and splenomegaly Takes Doptelet prior to procedures usually with improvement to the 30,000s No active bleeding. Giving 1 unit of platelet Serial H&H SCDs for DVT prophylaxis

## 2022-02-18 NOTE — Progress Notes (Signed)
Notified CCMD patient was off monitor for shower

## 2022-02-18 NOTE — Assessment & Plan Note (Addendum)
Acute on chronic leukopenia. Strep bacteremia WBC 900 down from 1800 on 10/26 for an [place] of 500 on admission, slight improvement to 1 today Patient has longstanding chronic leukopenia s/p hematology work-up with bone marrow biopsy in 2019, which remained inconclusive. Acute worsening possibly related to marrow suppression from viral illness We will treat as neutropenic fever. Blood cultures with Streptococcus species.  MRSA PCR negative, procalcitonin positive at 0.49.  Recent urine cultures negative.  RVP negative -Switch antibiotics with ceftriaxone -Follow-up final culture results -Repeat blood cultures -Echocardiogram -We will consult ID tomorrow

## 2022-02-18 NOTE — Progress Notes (Signed)
       CROSS COVER NOTE  NAME: Eugene Cameron MRN: [id number] DOB : February 15, 1956 ATTENDING PHYSICIAN: Arnetha Courser, MD    Date of Service   02/18/2022   HPI/Events of Note   Medication request received from patient for Gabapentin as a sleep aid  Interventions   Assessment/Plan: Continue to hold Gabapentin 2/2 Acute on Chronic Leukopenia Melatonin x1     This document was prepared using Dragon voice recognition software and may include unintentional dictation errors.  Bishop Limbo DNP, MBA, FNP-BC Nurse Practitioner [facility] Pager [phone]

## 2022-02-18 NOTE — Progress Notes (Signed)
Progress Note   Patient: Eugene Cameron:[id number] DOB: 1956/02/11 DOA: 02/16/2022     2 DOS: the patient was seen and examined on 02/18/2022   Brief hospital course: Taken from H&P.   Eugene Cameron is a 66 y.o. male with medical history significant for Type 2 diabetes, celiac disease, Eugene Cameron cirrhosis since 2505 complicated by esophageal varices and portal hypertensive gastropathy as well as chronic thrombocytopenia and leukopenia with negative hematology work-up in 2019, who presents to the ED from urgent care with a 2 day history of flulike symptoms of fever, chills, body aches, and nausea.  He denies cough or shortness of breath or chest pain.has nausea but denies vomiting, diarrhea or abdominal pain.  Denies headache visual disturbance or neck pain.  He was empirically started on Tamiflu on 11/15.   ED course.Tmax 103 with pulse of 100 respirations up to 23 with O2 sat mid to high 90s on room air.  BP 122/67.  Labs significant for WBC 0.9, down from baseline of 1.8 on 10/26 and platelets 19,000, down from 24,000 on same date.  Creatinine at baseline at 1.4.  Glucose 206.  COVID and flu negative, HIV nonreactive, HCV and hepatitis B surface antigen in process.  Troponin 6.  RSV was done at the urgent care and was reportedly negative.EKG, personally reviewed and interpreted shows NSR at 87 with no ischemic ST-T wave changes. CTA chest was unrevealing.  11/18: Febrile at 100.1, at 0.8, platelets at 18, slight decrease in all cell lines likely some dilutional effect.  Preliminary blood cultures with Streptococcus species.  Recent urine cultures negative.  Procalcitonin at 0.49.  MRSA PCR negative.  RVP pending Discontinue Tamiflu.  Patient was on cefepime, vancomycin and Flagyl-switching antibiotics with ceftriaxone only.  11/19: Afebrile this morning.  Slight improvement in WBCs to 1 and platelet decreased to 17. Still pending susceptibilities.  Repeating blood culture, also ordered  echocardiogram. Giving 1 unit of platelet.  Continue ceftriaxone for now.     Assessment and Plan: * Neutropenic fever (HCC) Acute on chronic leukopenia. Strep bacteremia WBC 900 down from 1800 on 10/26 for an [place] of 500 on admission, slight improvement to 1 today Patient has longstanding chronic leukopenia s/p hematology work-up with bone marrow biopsy in 2019, which remained inconclusive. Acute worsening possibly related to marrow suppression from viral illness We will treat as neutropenic fever. Blood cultures with Streptococcus species.  MRSA PCR negative, procalcitonin positive at 0.49.  Recent urine cultures negative.  RVP negative -Switch antibiotics with ceftriaxone -Follow-up final culture results -Repeat blood cultures -Echocardiogram -We will consult ID tomorrow   Flu-like symptoms Patient presents with body ache, malaise, fever COVID flu and RSV negative We will get respiratory viral panel-pending results Supportive care  Acute on chronic thrombocytopenia (HCC) Splenomegaly Baseline platelet count 24-25,000, now down to 19,000>.18000.17000 Chart review reveals extensive hematology work-up 2019 including bone marrow bone biopsy Thrombocytopenia believed related to liver disease and splenomegaly Takes Doptelet prior to procedures usually with improvement to the 30,000s No active bleeding. Giving 1 unit of platelet Serial H&H SCDs for DVT prophylaxis  Splenomegaly -See above  Uncontrolled type 2 diabetes mellitus with hyperglycemia, with long-term current use of insulin (HCC) Blood sugar within goal today.  A1c of 8.3 Sliding scale insulin coverage and basal insulin.  Hold oral home hypoglycemic agent  Liver cirrhosis secondary to NASH (nonalcoholic steatohepatitis) ([place]) Splenomegaly/portal hypertensive gastropathy/esophageal varices/splenomegaly Followed by [facility]enterology, last seen in October Last EGD was January 2023 s/p banding  Continue  Coreg  Stage 3a chronic kidney disease ([place]) Renal function at baseline -Monitor renal function -Avoid nephrotoxins   Subjective: Patient still having some body aches but overall feeling little improved.  No new complaints.  No fever this morning  Physical Exam: Vitals:   02/18/22 0412 02/18/22 0804 02/18/22 1238 02/18/22 1400  BP: (!) 104/59 109/64 102/65   Pulse: 66 69 (!) 58 66  Resp: _0 Temp: 98.3 F (36.8 C) 97.9 F (36.6 C) (!) 97.5 F (36.4 C)   TempSrc: Oral     SpO2: 98% 99% 99%   Weight:      Height:       General.  Well developed gentleman, in no acute distress. Pulmonary.  Lungs clear bilaterally, normal respiratory effort. CV.  Regular rate and rhythm, no JVD, rub or murmur. Abdomen.  Soft, nontender, nondistended, BS positive. CNS.  Alert and oriented .  No focal neurologic deficit. Extremities.  No edema, no cyanosis, pulses intact and symmetrical. Psychiatry.  Judgment and insight appears normal.    Data Reviewed: Prior data reviewed  Family Communication: Discussed with wife at bedside  Disposition: Status is: Inpatient Remains inpatient appropriate because: Severity of illness  Planned Discharge Destination: Home  DVT prophylaxis.  SCDs Time spent: 50 minutes  This record has been created using Systems analyst. Errors have been sought and corrected,but may not always be located. Such creation errors do not reflect on the standard of care.   Author: Lorella Nimrod, MD 02/18/2022 2:54 PM  For on call review [email or web address].

## 2022-02-19 ENCOUNTER — Inpatient Hospital Stay (HOSPITAL_COMMUNITY)
Admit: 2022-02-19 | Discharge: 2022-02-19 | Disposition: A | Discharge status: Home / Self Care | Payer: 59 | Attending: Internal Medicine | Admitting: Internal Medicine

## 2022-02-19 DIAGNOSIS — R7881 Bacteremia: Secondary | ICD-10-CM

## 2022-02-19 DIAGNOSIS — R5081 Fever presenting with conditions classified elsewhere: Secondary | ICD-10-CM | POA: Diagnosis not present

## 2022-02-19 DIAGNOSIS — D709 Neutropenia, unspecified: Secondary | ICD-10-CM | POA: Diagnosis not present

## 2022-02-19 DIAGNOSIS — B955 Unspecified streptococcus as the cause of diseases classified elsewhere: Secondary | ICD-10-CM

## 2022-02-19 DIAGNOSIS — R509 Fever, unspecified: Secondary | ICD-10-CM

## 2022-02-19 LAB — BASIC METABOLIC PANEL
Anion gap: 5 (ref 5–15)
BUN: 23 mg/dL (ref 8–23)
CO2: 24 mmol/L (ref 22–32)
Calcium: 8.7 mg/dL — ABNORMAL LOW (ref 8.9–10.3)
Chloride: 109 mmol/L (ref 98–111)
Creatinine, Ser: 1.21 mg/dL (ref 0.61–1.24)
GFR, Estimated: >60 mL/min (ref >60)
Glucose, Bld: 117 mg/dL — ABNORMAL HIGH (ref 70–99)
Potassium: 3.9 mmol/L (ref 3.5–5.1)
Sodium: 138 mmol/L (ref 135–145)

## 2022-02-19 LAB — ECHOCARDIOGRAM COMPLETE
AR max vel: 2.43 cm2
AV Area VTI: 2.3 cm2
AV Area mean vel: 2.52 cm2
AV Mean grad: 6 mmHg
AV Peak grad: 11.2 mmHg
Ao pk vel: 1.67 m/s
Area-P 1/2: 2.73 cm2
Height: 72 in
S' Lateral: 3.5 cm
Weight: 3530.89 oz

## 2022-02-19 LAB — BPAM PLATELET PHERESIS
Blood Product Expiration Date: 202311212359
ISSUE DATE / TIME: 202311191513
Unit Type and Rh: 6200

## 2022-02-19 LAB — PROCALCITONIN: Procalcitonin: 0.48 ng/mL

## 2022-02-19 LAB — CBC
HCT: 34.9 % — ABNORMAL LOW (ref 39.0–52.0)
Hemoglobin: 11.7 g/dL — ABNORMAL LOW (ref 13.0–17.0)
MCH: 27.9 pg (ref 26.0–34.0)
MCHC: 33.5 g/dL (ref 30.0–36.0)
MCV: 83.3 fL (ref 80.0–100.0)
Platelets: 17 10*3/uL — CL (ref 150–400)
RBC: 4.19 MIL/uL — ABNORMAL LOW (ref 4.22–5.81)
RDW: 16.4 % — ABNORMAL HIGH (ref 11.5–15.5)
WBC: 1.4 10*3/uL — CL (ref 4.0–10.5)
nRBC: 0 % (ref 0.0–0.2)

## 2022-02-19 LAB — GLUCOSE, CAPILLARY
Glucose-Capillary: 125 mg/dL — ABNORMAL HIGH (ref 70–99)
Glucose-Capillary: 153 mg/dL — ABNORMAL HIGH (ref 70–99)
Glucose-Capillary: 219 mg/dL — ABNORMAL HIGH (ref 70–99)

## 2022-02-19 LAB — CULTURE, BLOOD (ROUTINE X 2): Special Requests: ADEQUATE

## 2022-02-19 LAB — PREPARE PLATELET PHERESIS: Unit division: 0

## 2022-02-19 LAB — RHEUMATOID FACTOR: Rheumatoid fact SerPl-aCnc: <10 IU/mL (ref <14.0)

## 2022-02-19 MED ORDER — LEVOFLOXACIN 750 MG PO TABS: 750.0000 mg | ORAL_TABLET | Freq: Every day | ORAL | 0 refills | Status: AC

## 2022-02-19 NOTE — Progress Notes (Signed)
*  PRELIMINARY RESULTS* Echocardiogram 2D Echocardiogram has been performed.  Eugene Cameron Sharisse Rantz 02/19/2022, 8:52 AM

## 2022-02-19 NOTE — Progress Notes (Signed)
Nutrition Brief Note  Patient identified on the Malnutrition Screening Tool (MST) Report  Wt Readings from Last 15 Encounters:  02/17/22 100.1 kg  06/04/21 99.8 kg  04/18/21 99.3 kg  03/17/21 99.8 kg  08/02/20 103 kg  07/26/20 104.3 kg   Pt with medical history significant for Type 2 diabetes, celiac disease, [place] cirrhosis since 2012 complicated by esophageal varices and portal hypertensive gastropathy as well as chronic thrombocytopenia and leukopenia with negative hematology work-up in 2019, who presents with a 2 day history of flulike symptoms of fever, chills, body aches, and nausea.   Pt admitted with neutropenic fever.   Pt unavailable at time of visit. Pt out of room at time of visit. RD unable to obtain further nutrition-related history or complete nutrition-focused physical exam at this time.    Wt has been stable over the past year.   Per MD notes, possible discharge today.  Medications reviewed and include lasix.  Lab Results  Component Value Date   HGBA1C 8.3 (H) 02/17/2022   PTA DM medications are 50 units tresiba daily, 500 mg metformin daily.   Labs reviewed: CBGS: 153 (inpatient orders for glycemic control are 0-15 units insulin aspart TID with meals, 0-5 units insulin aspart daily at bedtime, and 50 units insulin glargine-yfgn daily).    Current diet order is heart healthy, carb modified (liberalized to carb modfied), patient is consuming approximately 100% of meals at this time. Labs and medications reviewed.   No nutrition interventions warranted at this time. If nutrition issues arise, please consult RD.   Levada Schilling, RD, LDN, CDCES Registered Dietitian II Certified Diabetes Care and Education Specialist Please refer to [facility] for RD and/or RD on-call/weekend/after hours pager

## 2022-02-19 NOTE — Discharge Summary (Signed)
Physician Discharge Summary   Patient: Eugene Cameron MRN: [id number] DOB: March 08, 1956  Admit date:     02/16/2022  Discharge date: 02/19/22  Discharge Physician: Lorella Nimrod   PCP: Su Ley, MD   Recommendations at discharge:  Please obtain CBC and CMP within a week Follow-up with infectious disease specialist within 2 weeks Follow-up with primary care provider  Discharge Diagnoses: Principal Problem:   Neutropenic fever ([place]) Active Problems:   Flu-like symptoms   Acute on chronic thrombocytopenia ([place])   Splenomegaly   Uncontrolled type 2 diabetes mellitus with hyperglycemia, with long-term current use of insulin ([place])   Liver cirrhosis secondary to NASH (nonalcoholic steatohepatitis) ([place])   Stage 3a chronic kidney disease ([place])   Fever   Streptococcal bacteremia   Hospital Course: Taken from H&P.   Eugene Cameron is a 66 y.o. male with medical history significant for Type 2 diabetes, celiac disease, Eugene Cameron cirrhosis since [age] complicated by esophageal varices and portal hypertensive gastropathy as well as chronic thrombocytopenia and leukopenia with negative hematology work-up in 2019, who presents to the ED from urgent care with a 2 day history of flulike symptoms of fever, chills, body aches, and nausea.  He denies cough or shortness of breath or chest pain.has nausea but denies vomiting, diarrhea or abdominal pain.  Denies headache visual disturbance or neck pain.  He was empirically started on Tamiflu on 11/15.   ED course.Tmax 103 with pulse of 100 respirations up to 23 with O2 sat mid to high 90s on room air.  BP 122/67.  Labs significant for WBC 0.9, down from baseline of 1.8 on 10/26 and platelets 19,000, down from 24,000 on same date.  Creatinine at baseline at 1.4.  Glucose 206.  COVID and flu negative, HIV nonreactive, HCV and hepatitis B surface antigen in process.  Troponin 6.  RSV was done at the urgent care and was reportedly negative.EKG, personally  reviewed and interpreted shows NSR at 87 with no ischemic ST-T wave changes. CTA chest was unrevealing.  11/18: Febrile at 100.1, at 0.8, platelets at 18, slight decrease in all cell lines likely some dilutional effect.  Preliminary blood cultures with Streptococcus species.  Recent urine cultures negative.  Procalcitonin at 0.49.  MRSA PCR negative.  RVP pending Discontinue Tamiflu.  Patient was on cefepime, vancomycin and Flagyl-switching antibiotics with ceftriaxone only.  11/19: Afebrile this morning.  Slight improvement in WBCs to 1 and platelet decreased to 17. Still pending susceptibilities.  Repeating blood culture, also ordered echocardiogram. Giving 1 unit of platelet.  Continue ceftriaxone for now.  11/20: Remained afebrile, WBC with slight improvement 1.4, platelet remained 17 despite getting 1 unit of platelet.  Repeat blood cultures negative in 24 hours.  Echocardiogram without any abnormality.  Patient is not a candidate for TEE due to thrombocytopenia. He wants to go home as he has his upcoming appointment with his hepatologist tomorrow.  Discussed with ID, Dr. Rosiland Cameron, who would like to continue IV antibiotics but okay to convert to levofloxacin for 2 weeks as he have to leave. Patient need to follow-up with infectious disease within next 2 weeks for further recommendations.  Discussed with patient and he agrees to follow-up with [facility] disease [facility] and [facility] as all of his care is over there.  Patient is being discharged on levofloxacin for 2 more weeks and need to have a close follow-up with his providers and infectious disease person for further recommendations.     Assessment and Plan: * Neutropenic  fever (HCC) Acute on chronic leukopenia. Strep bacteremia WBC 900 down from 1800 on 10/26 for an [place] of 500 on admission, slight improvement to 1 today Patient has longstanding chronic leukopenia s/p hematology work-up with bone marrow biopsy in 2019, which  remained inconclusive. Acute worsening possibly related to marrow suppression from viral illness We will treat as neutropenic fever. Blood cultures with Streptococcus species.  MRSA PCR negative, procalcitonin positive at 0.49.  Recent urine cultures negative.  RVP negative -Switch antibiotics with ceftriaxone -Follow-up final culture results -Repeat blood cultures -Echocardiogram -We will consult ID tomorrow   Flu-like symptoms Patient presents with body ache, malaise, fever COVID flu and RSV negative We will get respiratory viral panel-pending results Supportive care  Acute on chronic thrombocytopenia (HCC) Splenomegaly Baseline platelet count 24-25,000, now down to 19,000>.18000.17000 Chart review reveals extensive hematology work-up 2019 including bone marrow bone biopsy Thrombocytopenia believed related to liver disease and splenomegaly Takes Doptelet prior to procedures usually with improvement to the 30,000s No active bleeding. Giving 1 unit of platelet Serial H&H SCDs for DVT prophylaxis  Splenomegaly -See above  Uncontrolled type 2 diabetes mellitus with hyperglycemia, with long-term current use of insulin (HCC) Blood sugar within goal today.  A1c of 8.3 Sliding scale insulin coverage and basal insulin.  Hold oral home hypoglycemic agent  Liver cirrhosis secondary to NASH (nonalcoholic steatohepatitis) ([place]) Splenomegaly/portal hypertensive gastropathy/esophageal varices/splenomegaly Followed by [facility] gastro[facility]ology, last seen in October Last EGD was January 2023 s/p banding Continue Coreg  Stage 3a chronic kidney disease ([place]) Renal function at baseline -Monitor renal function -Avoid nephrotoxins   Consultants: Infectious disease Procedures performed: None Disposition: Home Diet recommendation:  Discharge Diet Orders (From admission, onward)     Start     Ordered   02/19/22 0000  Diet - low sodium heart healthy        02/19/22 1710            Cardiac and Carb modified diet DISCHARGE MEDICATION: Allergies as of 02/19/2022       Reactions   Gluten Meal    Other reaction(s): Other (See Comments) GI upset   Cortisone    Other reaction(s): Liver Disorder   Statins    Other reaction(s): Other (See Comments), Other (See Comments) Liver disease contraindication Liver disease contraindication        Medication List     STOP taking these medications    cephALEXin 500 MG capsule Commonly known as: KEFLEX   docusate sodium 100 MG capsule Commonly known as: Colace   oseltamivir 75 MG capsule Commonly known as: TAMIFLU   oxyCODONE 5 MG immediate release tablet Commonly known as: Roxicodone       TAKE these medications    Accu-Chek Guide test strip Generic drug: glucose blood in the morning, at noon, and at bedtime.   BD Pen Needle Nano 2nd Gen 32G X 4 MM Misc Generic drug: Insulin Pen Needle daily.   carvedilol 6.25 MG tablet Commonly known as: COREG Take 1/2 tablet in morning, 1 tablet in evening   Dexcom G7 Sensor Misc USE 1 EACH EVERY 10 (TEN) DAYS   Doptelet 20 MG Tabs Generic drug: Avatrombopag Maleate Take by mouth.   Farxiga 5 MG Tabs tablet Generic drug: dapagliflozin propanediol Take 5 mg by mouth daily.   fluticasone 50 MCG/ACT nasal spray Commonly known as: FLONASE Place 2 sprays into the nose as needed.   furosemide 20 MG tablet Commonly known as: LASIX Take 20 mg by mouth daily.  gabapentin 100 MG capsule Commonly known as: NEURONTIN Take 300 mg by mouth at bedtime.   glipiZIDE 10 MG tablet Commonly known as: GLUCOTROL Take 10 mg by mouth 2 (two) times daily.   levofloxacin 750 MG tablet Commonly known as: Levaquin Take 1 tablet (750 mg total) by mouth daily for 20 days.   metFORMIN 500 MG tablet Commonly known as: GLUCOPHAGE Take by mouth.   ondansetron 4 MG disintegrating tablet Commonly known as: ZOFRAN-ODT Take 1 tablet (4 mg total) by mouth every 8 (eight)  hours as needed for nausea or vomiting.   pantoprazole 40 MG tablet Commonly known as: PROTONIX Take 1 tablet by mouth daily.   pioglitazone 30 MG tablet Commonly known as: ACTOS Take 30 mg by mouth daily.   RA Probiotic Digestive Care Caps Take 1 capsule by mouth daily.   tamsulosin 0.4 MG Caps capsule Commonly known as: FLOMAX Take 1 tablet by mouth daily until you pass the kidney stone or no longer have symptoms   Tresiba FlexTouch 100 UNIT/ML FlexTouch Pen Generic drug: insulin degludec Inject 50 Units into the skin at bedtime.        Discharge Exam: Filed Weights   02/17/22 0203  Weight: 100.1 kg   General.     In no acute distress. Pulmonary.  Lungs clear bilaterally, normal respiratory effort. CV.  Regular rate and rhythm, no JVD, rub or murmur. Abdomen.  Soft, nontender, nondistended, BS positive. CNS.  Alert and oriented .  No focal neurologic deficit. Extremities.  No edema, no cyanosis, pulses intact and symmetrical. Psychiatry.  Judgment and insight appears normal.   Condition at discharge: Stable  The results of significant diagnostics from this hospitalization (including imaging, microbiology, ancillary and laboratory) are listed below for reference.   Imaging Studies: ECHOCARDIOGRAM COMPLETE  Result Date: 02/19/2022    ECHOCARDIOGRAM REPORT   Patient Name:   Eugene Cameron Date of Exam: 02/19/2022 Medical Rec #:  [id number]       Height:       72.0 in Accession #:    [id number]      Weight:       220.7 lb Date of Birth:  September 18, 1955      BSA:          2.222 m Patient Age:    54 years        BP:           99/71 mmHg Patient Gender: M               HR:           61 bpm. Exam Location:  [facility] Procedure: 2D Echo, Color Doppler and Cardiac Doppler Indications:     R78.81 Bacteremia  History:         Patient has no prior history of Echocardiogram examinations.                  CKD, stage 3; Risk Factors:Diabetes.  Sonographer:     Charmayne Sheer Referring Phys:   [id number] CBULAGT Shayne Diguglielmo Diagnosing Phys: Kathlyn Sacramento MD IMPRESSIONS  1. Left ventricular ejection fraction, by estimation, is 55 to 60%. The left ventricle has normal function. The left ventricle has no regional wall motion abnormalities. Left ventricular diastolic parameters were normal.  2. Right ventricular systolic function is normal. The right ventricular size is normal.  3. Left atrial size was mildly dilated.  4. Right atrial size was mildly dilated.  5. The mitral valve is normal in  structure. Trivial mitral valve regurgitation. No evidence of mitral stenosis.  6. The aortic valve is normal in structure. Aortic valve regurgitation is not visualized. Aortic valve sclerosis is present, with no evidence of aortic valve stenosis.  7. The inferior vena cava is normal in size with greater than 50% respiratory variability, suggesting right atrial pressure of 3 mmHg. FINDINGS  Left Ventricle: Left ventricular ejection fraction, by estimation, is 55 to 60%. The left ventricle has normal function. The left ventricle has no regional wall motion abnormalities. The left ventricular internal cavity size was normal in size. There is  no left ventricular hypertrophy. Left ventricular diastolic parameters were normal. Right Ventricle: The right ventricular size is normal. No increase in right ventricular wall thickness. Right ventricular systolic function is normal. Left Atrium: Left atrial size was mildly dilated. Right Atrium: Right atrial size was mildly dilated. Pericardium: There is no evidence of pericardial effusion. Mitral Valve: The mitral valve is normal in structure. There is mild thickening of the mitral valve leaflet(s). Trivial mitral valve regurgitation. No evidence of mitral valve stenosis. Tricuspid Valve: The tricuspid valve is normal in structure. Tricuspid valve regurgitation is not demonstrated. No evidence of tricuspid stenosis. Aortic Valve: The aortic valve is normal in structure. Aortic valve  regurgitation is not visualized. Aortic valve sclerosis is present, with no evidence of aortic valve stenosis. Aortic valve mean gradient measures 6.0 mmHg. Aortic valve peak gradient measures 11.2 mmHg. Aortic valve area, by VTI measures 2.30 cm. Pulmonic Valve: The pulmonic valve was normal in structure. Pulmonic valve regurgitation is not visualized. No evidence of pulmonic stenosis. Aorta: The aortic root is normal in size and structure. Venous: The inferior vena cava is normal in size with greater than 50% respiratory variability, suggesting right atrial pressure of 3 mmHg. IAS/Shunts: No atrial level shunt detected by color flow Doppler.  LEFT VENTRICLE PLAX 2D LVIDd:         5.20 cm   Diastology LVIDs:         3.50 cm   LV e' medial:    10.00 cm/s LV PW:         1.00 cm   LV E/e' medial:  8.8 LV IVS:        0.90 cm   LV e' lateral:   13.10 cm/s LVOT diam:     2.10 cm   LV E/e' lateral: 6.7 LV SV:         86 LV SV Index:   39 LVOT Area:     3.46 cm  RIGHT VENTRICLE RV Basal diam:  4.80 cm RV S prime:     17.10 cm/s LEFT ATRIUM             Index        RIGHT ATRIUM           Index LA diam:        3.90 cm 1.76 cm/m   RA Area:     22.80 cm LA Vol (A2C):   46.5 ml 20.93 ml/m  RA Volume:   77.90 ml  35.07 ml/m LA Vol (A4C):   59.1 ml 26.60 ml/m LA Biplane Vol: 56.6 ml 25.48 ml/m  AORTIC VALVE                     PULMONIC VALVE AV Area (Vmax):    2.43 cm      PV Vmax:       1.41 m/s AV Area (Vmean):   2.52  cm      PV Vmean:      86.700 cm/s AV Area (VTI):     2.30 cm      PV VTI:        0.306 m AV Vmax:           167.00 cm/s   PV Peak grad:  8.0 mmHg AV Vmean:          114.000 cm/s  PV Mean grad:  4.0 mmHg AV VTI:            0.372 m AV Peak Grad:      11.2 mmHg AV Mean Grad:      6.0 mmHg LVOT Vmax:         117.00 cm/s LVOT Vmean:        83.000 cm/s LVOT VTI:          0.247 m LVOT/AV VTI ratio: 0.66  AORTA Ao Root diam: 3.20 cm MITRAL VALVE MV Area (PHT): 2.73 cm    SHUNTS MV Decel Time: 278 msec     Systemic VTI:  0.25 m MV E velocity: 87.50 cm/s  Systemic Diam: 2.10 cm MV A velocity: 69.70 cm/s MV E/A ratio:  1.26 Kathlyn Sacramento MD Electronically signed by Kathlyn Sacramento MD Signature Date/Time: 02/19/2022/9:28:14 AM    Final    CT Angio Chest PE W and/or Wo Contrast  Result Date: 02/16/2022 CLINICAL DATA:  Shortness of breath, fever EXAM: CT ANGIOGRAPHY CHEST WITH CONTRAST TECHNIQUE: Multidetector CT imaging of the chest was performed using the standard protocol during bolus administration of intravenous contrast. Multiplanar CT image reconstructions and MIPs were obtained to evaluate the vascular anatomy. RADIATION DOSE REDUCTION: This exam was performed according to the departmental dose-optimization program which includes automated exposure control, adjustment of the mA and/or kV according to patient size and/or use of iterative reconstruction technique. CONTRAST:  25m OMNIPAQUE IOHEXOL 350 MG/ML SOLN COMPARISON:  Abdominal CT 02/14/2022 FINDINGS: Cardiovascular: No filling defects in the pulmonary arteries to suggest pulmonary emboli. Cardiomegaly. Scattered coronary artery and aortic calcifications. No evidence of aortic aneurysm or dissection. Mediastinum/Nodes: No mediastinal, hilar, or axillary adenopathy. Trachea and esophagus are unremarkable. Thyroid unremarkable. Lungs/Pleura: Linear atelectasis in the lung bases.  No effusions. Upper Abdomen: Splenomegaly as seen on recent abdominal CT. Changes of cirrhosis. Upper abdominal varices noted. Findings unchanged since recent abdominal CT. Musculoskeletal: Chest wall soft tissues are unremarkable. No acute bony abnormality. Review of the MIP images confirms the above findings. IMPRESSION: No evidence of pulmonary embolus. Bibasilar atelectasis. Scattered coronary artery calcifications. Cirrhosis with splenomegaly and upper abdominal varices, unchanged since recent abdominal CT. Aortic Atherosclerosis (ICD10-I70.0). Electronically Signed   By:  KRolm BaptiseM.D.   On: 02/16/2022 21:26   DG Chest 2 View  Result Date: 02/16/2022 CLINICAL DATA:  Chest pain.  Shortness of breath.  Fever and chills. EXAM: CHEST - 2 VIEW COMPARISON:  None Available. FINDINGS: The heart size and mediastinal contours are within normal limits. Azygous fissure is incidentally noted. Bandlike opacities are seen in the left lower lung which may be due to scarring or atelectasis. No evidence of pulmonary consolidation or edema. No evidence of pleural effusion. IMPRESSION: Left lower lung scarring versus atelectasis. Electronically Signed   By: JMarlaine HindM.D.   On: 02/16/2022 18:04   CT RENAL STONE STUDY  Result Date: 02/14/2022 CLINICAL DATA:  History of flank pain without urinary symptoms in a 66year old EXAM: CT ABDOMEN AND PELVIS WITHOUT CONTRAST TECHNIQUE: Multidetector CT imaging  of the abdomen and pelvis was performed following the standard protocol without IV contrast. RADIATION DOSE REDUCTION: This exam was performed according to the departmental dose-optimization program which includes automated exposure control, adjustment of the mA and/or kV according to patient size and/or use of iterative reconstruction technique. COMPARISON:  Aug 02, 2020 FINDINGS: Lower chest: Basilar atelectasis. No effusion. No consolidative changes. Heart size is normal without pericardial effusion. There is no chest wall abnormality. Hepatobiliary: Lobular hepatic contours compatible with cirrhosis with marked fissural widening of hepatic fissures. No gross biliary duct distension following cholecystectomy. Pancreas: Pancreas with normal contours, no signs of inflammation or peripancreatic fluid. Spleen: Massive splenomegaly measuring 20 x 13 x 25 (volume = 3400 cc) cm as compared to 21 x 10 x 23 (volume = 2500 cc. No stranding adjacent to the spleen. Homogeneous splenic attenuation.) Cm Adrenals/Urinary Tract: Smooth renal contours. Mild perinephric stranding. No nephrolithiasis,  hydronephrosis or substantial perivesical stranding at this time. Normal adrenal glands. Stomach/Bowel: Varices adjacent to the distal esophagus at about the stomach. No acute gastrointestinal findings. Appendix not visualized. No secondary signs to suggest acute appendiceal process however. Vascular/Lymphatic: Subtle calcifications of the portal vein and splenic vein along the wall of these veins tracking also into the SMV also seen on previous imaging. No aortic dilation. Portosystemic collaterals about the abdomen which are similar to the previous study. No adenopathy in the abdomen or in the pelvis. Reproductive: Unremarkable by CT. Other: No ascites Musculoskeletal: No acute bone finding. No destructive bone process. Spinal degenerative changes. IMPRESSION: 1. No nephrolithiasis, hydronephrosis or substantial perivesical stranding at this time. 2. Massive splenomegaly with increase in measured volume, proximally 30% increase. This could explain LEFT-sided flank pain. Based on splenic size complication such as splenic rupture can be a concern though there are no current secondary signs that would suggest this possibility. If symptoms worsen consider further evaluation with contrasted imaging. 3. Signs of cirrhosis and portal hypertension as before. 4. Subtle calcifications of the portal vein and splenic vein along the wall of these veins tracking also into the SMV also seen on previous imaging. Findings are nonspecific but can be seen in the setting of chronic portal vein thrombosis. These results will be called to the ordering clinician or representative by the Radiologist Assistant, and communication documented in the PACS or [employer]. Electronically Signed   By: Zetta Bills M.D.   On: 02/14/2022 16:25    Microbiology: Results for orders placed or performed during the hospital encounter of 02/16/22  Resp panel by RT-PCR (RSV, Flu A&B, Covid) Anterior Nasal Swab     Status: None   Collection  Time: 02/16/22  5:35 PM   Specimen: Anterior Nasal Swab  Result Value Ref Range Status   SARS Coronavirus 2 by RT PCR NEGATIVE NEGATIVE Final    Comment: (NOTE) SARS-CoV-2 target nucleic acids are NOT DETECTED.  The SARS-CoV-2 RNA is generally detectable in upper respiratory specimens during the acute phase of infection. The lowest concentration of SARS-CoV-2 viral copies this assay can detect is 138 copies/mL. A negative result does not preclude SARS-Cov-2 infection and should not be used as the sole basis for treatment or other patient management decisions. A negative result may occur with  improper specimen collection/handling, submission of specimen other than nasopharyngeal swab, presence of viral mutation(s) within the areas targeted by this assay, and inadequate number of viral copies(<138 copies/mL). A negative result must be combined with clinical observations, patient history, and epidemiological information. The expected result is  Negative.  Fact Sheet for Patients:  EntrepreneurPulse.com.au  Fact Sheet for Healthcare Providers:  IncredibleEmployment.be  This test is no t yet approved or cleared by the [place] FDA and  has been authorized for detection and/or diagnosis of SARS-CoV-2 by FDA under an Emergency Use Authorization (EUA). This EUA will remain  in effect (meaning this test can be used) for the duration of the COVID-19 declaration under Section 564(b)(1) of the Act, 21 U.S.C.section 360bbb-3(b)(1), unless the authorization is terminated  or revoked sooner.       Influenza A by PCR NEGATIVE NEGATIVE Final   Influenza B by PCR NEGATIVE NEGATIVE Final    Comment: (NOTE) The Xpert Xpress SARS-CoV-2/FLU/RSV plus assay is intended as an aid in the diagnosis of influenza from Nasopharyngeal swab specimens and should not be used as a sole basis for treatment. Nasal washings and aspirates are unacceptable for Xpert Xpress  SARS-CoV-2/FLU/RSV testing.  Fact Sheet for Patients: EntrepreneurPulse.com.au  Fact Sheet for Healthcare Providers: IncredibleEmployment.be  This test is not yet approved or cleared by the [place] FDA and has been authorized for detection and/or diagnosis of SARS-CoV-2 by FDA under an Emergency Use Authorization (EUA). This EUA will remain in effect (meaning this test can be used) for the duration of the COVID-19 declaration under Section 564(b)(1) of the Act, 21 U.S.C. section 360bbb-3(b)(1), unless the authorization is terminated or revoked.     Resp Syncytial Virus by PCR NEGATIVE NEGATIVE Final    Comment: (NOTE) Fact Sheet for Patients: EntrepreneurPulse.com.au  Fact Sheet for Healthcare Providers: IncredibleEmployment.be  This test is not yet approved or cleared by the [place] FDA and has been authorized for detection and/or diagnosis of SARS-CoV-2 by FDA under an Emergency Use Authorization (EUA). This EUA will remain in effect (meaning this test can be used) for the duration of the COVID-19 declaration under Section 564(b)(1) of the Act, 21 U.S.C. section 360bbb-3(b)(1), unless the authorization is terminated or revoked.  Performed at [facility], [address] [place], [place] [address]   Blood culture (routine x 2)     Status: Abnormal   Collection Time: 02/16/22 11:59 PM   Specimen: BLOOD LEFT ARM  Result Value Ref Range Status   Specimen Description   Final    BLOOD LEFT ARM Performed at [facility], [address], [place], [place] [address]    Special Requests   Final    Blood Culture results may not be optimal due to an excessive volume of blood received in culture bottles Performed at [facility], [address] [place], [place] [address]    Culture  Setup Time   Final    GRAM POSITIVE COCCI IN BOTH AEROBIC AND ANAEROBIC  BOTTLES CRITICAL RESULT CALLED TO, READ BACK BY AND VERIFIED WITH: ALEX CHAPPELL 02/17/22 1121 AMK Performed at [facility], [address] [place], [place] [address]    Culture (A)  Final    STREPTOCOCCUS SALIVARIUS SUSCEPTIBILITIES PERFORMED ON PREVIOUS CULTURE WITHIN THE LAST 5 DAYS. Performed at [facility], [place] [address] [place], [place] [address]    Report Status 02/19/2022 FINAL  Final  Blood culture (routine x 2)     Status: Abnormal   Collection Time: 02/16/22 11:59 PM   Specimen: BLOOD RIGHT ARM  Result Value Ref Range Status   Specimen Description   Final    BLOOD RIGHT ARM Performed at [facility], [address], [place], [place] [address]  Special Requests   Final    Blood Culture adequate volume Performed at [address] [place], [place] [address]    Culture  Setup Time   Final    GRAM POSITIVE COCCI IN BOTH AEROBIC AND ANAEROBIC BOTTLES CRITICAL RESULT CALLED TO, READ BACK BY AND VERIFIED WITH: ALEX CHAPPELL 02/17/22 1121 AMK Performed at [facility], [place] [address], [place], [place] [address]    Culture STREPTOCOCCUS SALIVARIUS (A)  Final   Report Status 02/19/2022 FINAL  Final   Organism ID, Bacteria STREPTOCOCCUS SALIVARIUS  Final      Susceptibility   Streptococcus salivarius - MIC*    PENICILLIN 1 INTERMEDIATE Intermediate     CEFTRIAXONE 1 SENSITIVE Sensitive     ERYTHROMYCIN 2 RESISTANT Resistant     LEVOFLOXACIN 2 SENSITIVE Sensitive     VANCOMYCIN 0.5 SENSITIVE Sensitive     * STREPTOCOCCUS SALIVARIUS  Blood Culture ID Panel (Reflexed)     Status: Abnormal   Collection Time: 02/16/22 11:59 PM  Result Value Ref Range Status   Enterococcus faecalis NOT DETECTED NOT DETECTED Final   Enterococcus Faecium NOT DETECTED NOT DETECTED Final   Listeria monocytogenes NOT DETECTED NOT DETECTED Final   Staphylococcus species NOT DETECTED NOT DETECTED Final   Staphylococcus aureus (BCID) NOT  DETECTED NOT DETECTED Final   Staphylococcus epidermidis NOT DETECTED NOT DETECTED Final   Staphylococcus lugdunensis NOT DETECTED NOT DETECTED Final   Streptococcus species DETECTED (A) NOT DETECTED Final    Comment: Not Enterococcus species, Streptococcus agalactiae, Streptococcus pyogenes, or Streptococcus pneumoniae. CRITICAL RESULT CALLED TO, READ BACK BY AND VERIFIED WITH: ALEX CHAPPELL 02/17/22 1121 AMK    Streptococcus agalactiae NOT DETECTED NOT DETECTED Final   Streptococcus pneumoniae NOT DETECTED NOT DETECTED Final   Streptococcus pyogenes NOT DETECTED NOT DETECTED Final   A.calcoaceticus-baumannii NOT DETECTED NOT DETECTED Final   Bacteroides fragilis NOT DETECTED NOT DETECTED Final   Enterobacterales NOT DETECTED NOT DETECTED Final   Enterobacter cloacae complex NOT DETECTED NOT DETECTED Final   Escherichia coli NOT DETECTED NOT DETECTED Final   Klebsiella aerogenes NOT DETECTED NOT DETECTED Final   Klebsiella oxytoca NOT DETECTED NOT DETECTED Final   Klebsiella pneumoniae NOT DETECTED NOT DETECTED Final   Proteus species NOT DETECTED NOT DETECTED Final   Salmonella species NOT DETECTED NOT DETECTED Final   Serratia marcescens NOT DETECTED NOT DETECTED Final   Haemophilus influenzae NOT DETECTED NOT DETECTED Final   Neisseria meningitidis NOT DETECTED NOT DETECTED Final   Pseudomonas aeruginosa NOT DETECTED NOT DETECTED Final   Stenotrophomonas maltophilia NOT DETECTED NOT DETECTED Final   Candida albicans NOT DETECTED NOT DETECTED Final   Candida auris NOT DETECTED NOT DETECTED Final   Candida glabrata NOT DETECTED NOT DETECTED Final   Candida krusei NOT DETECTED NOT DETECTED Final   Candida parapsilosis NOT DETECTED NOT DETECTED Final   Candida tropicalis NOT DETECTED NOT DETECTED Final   Cryptococcus neoformans/gattii NOT DETECTED NOT DETECTED Final    Comment: Performed at [facility], [address] [place], [place] [address]  MRSA Next Gen by PCR,  Nasal     Status: None   Collection Time: 02/17/22  8:37 AM   Specimen: Nasal Mucosa; Nasal Swab  Result Value Ref Range Status   MRSA by PCR Next Gen NOT DETECTED NOT DETECTED Final    Comment: (NOTE) The GeneXpert MRSA Assay (FDA approved for NASAL specimens only), is one component of a comprehensive MRSA colonization surveillance program. It  is not intended to diagnose MRSA infection nor to guide or monitor treatment for MRSA infections. Test performance is not FDA approved in patients less than 55 years old. Performed at [facility], [address] [place], [place] [address]   Respiratory (~20 pathogens) panel by PCR     Status: None   Collection Time: 02/17/22  8:37 AM   Specimen: Nasopharyngeal Swab; Respiratory  Result Value Ref Range Status   Adenovirus NOT DETECTED NOT DETECTED Final   Coronavirus 229E NOT DETECTED NOT DETECTED Final    Comment: (NOTE) The Coronavirus on the Respiratory Panel, DOES NOT test for the novel  Coronavirus (2019 nCoV)    Coronavirus HKU1 NOT DETECTED NOT DETECTED Final   Coronavirus NL63 NOT DETECTED NOT DETECTED Final   Coronavirus OC43 NOT DETECTED NOT DETECTED Final   Metapneumovirus NOT DETECTED NOT DETECTED Final   Rhinovirus / Enterovirus NOT DETECTED NOT DETECTED Final   Influenza A NOT DETECTED NOT DETECTED Final   Influenza B NOT DETECTED NOT DETECTED Final   Parainfluenza Virus 1 NOT DETECTED NOT DETECTED Final   Parainfluenza Virus 2 NOT DETECTED NOT DETECTED Final   Parainfluenza Virus 3 NOT DETECTED NOT DETECTED Final   Parainfluenza Virus 4 NOT DETECTED NOT DETECTED Final   Respiratory Syncytial Virus NOT DETECTED NOT DETECTED Final   Bordetella pertussis NOT DETECTED NOT DETECTED Final   Bordetella Parapertussis NOT DETECTED NOT DETECTED Final   Chlamydophila pneumoniae NOT DETECTED NOT DETECTED Final   Mycoplasma pneumoniae NOT DETECTED NOT DETECTED Final    Comment: Performed at [facility], [place].  [address] [place], [place] [address]  Culture, blood (Routine X 2) w Reflex to ID Panel     Status: None (Preliminary result)   Collection Time: 02/18/22 12:23 PM   Specimen: BLOOD LEFT HAND  Result Value Ref Range Status   Specimen Description BLOOD LEFT HAND  Final   Special Requests   Final    BOTTLES DRAWN AEROBIC AND ANAEROBIC Blood Culture adequate volume   Culture   Final    NO GROWTH < 24 HOURS Performed at [facility], [address] [place], [place] [address]    Report Status PENDING  Incomplete  Culture, blood (Routine X 2) w Reflex to ID Panel     Status: None (Preliminary result)   Collection Time: 02/18/22 12:30 PM   Specimen: BLOOD RIGHT HAND  Result Value Ref Range Status   Specimen Description BLOOD RIGHT HAND  Final   Special Requests   Final    BOTTLES DRAWN AEROBIC AND ANAEROBIC Blood Culture adequate volume   Culture   Final    NO GROWTH < 24 HOURS Performed at [facility], [address] [place], [place] [address]    Report Status PENDING  Incomplete    Labs: CBC: Recent Labs  Lab 02/16/22 1847 02/17/22 0533 02/18/22 0525 02/19/22 0536  WBC 0.9*  0.9* 0.8* 1.1* 1.4*  NEUTROABS 0.5*  --   --   --   HGB 13.6  13.6 11.3* 11.5* 11.7*  HCT 40.6  40.5 33.1* 33.6* 34.9*  MCV 83.9  83.7 82.5 83.4 83.3  PLT 19*  19* 18* 17* 17*   Basic Metabolic Panel: Recent Labs  Lab 02/16/22 1739 02/17/22 0533 02/18/22 0525 02/19/22 0536  NA 134* 135  --  138  K 4.3 3.7  --  3.9  CL 105 110  --  109  CO2 22 20*  --  24  GLUCOSE 206* 231*  --  117*  Cameron 21 18  --  23  CREATININE 1.40* 1.19 1.22 1.21  CALCIUM 8.6* 7.9*  --  8.7*   Liver Function Tests: No results for input(s): "AST", "ALT", "ALKPHOS", "BILITOT", "PROT", "ALBUMIN" in the last 168 hours. CBG: Recent Labs  Lab 02/18/22 1614 02/18/22 2139 02/19/22 0824 02/19/22 1153 02/19/22 1654  GLUCAP 91 205* 125* 153* 219*    Discharge time spent: greater than 30  minutes.  This record has been created using Systems analyst. Errors have been sought and corrected,but may not always be located. Such creation errors do not reflect on the standard of care.   Signed: Lorella Nimrod, MD [facility] 02/19/2022

## 2022-02-19 NOTE — Progress Notes (Signed)
Discharge instructions provided to both patient and patient's wife. Both verbalized understanding of importance of follow up appointments and abx rx. Patient currently has PCP appointment set for Monday.

## 2022-02-19 NOTE — Progress Notes (Signed)
ECHO at bedside.

## 2022-02-19 NOTE — Consult Note (Signed)
[facility]                                                                                        Patient Identification: Patient Name: Eugene Cameron MRN: [id number] Admit Date: 02/16/2022  7:46 PM Today's Date: 02/19/2022 Reason for consult: Strep bacteremia Requesting provider: Arnetha Courser  Remote consult done without video evaluation as caregility is not working. Recommendations are based on chart review.   Principal Problem:   Neutropenic fever (HCC) Active Problems:   Splenomegaly   Acute on chronic thrombocytopenia (HCC)   Uncontrolled type 2 diabetes mellitus with hyperglycemia, with long-term current use of insulin (HCC)   Stage 3a chronic kidney disease (HCC)   Flu-like symptoms   Liver cirrhosis secondary to NASH (nonalcoholic steatohepatitis) (HCC)   Antibiotics:  Vancomycin 11/17- Cefepime 11/17-11/18, ceftriaxone 11/19-c Metronidazole 11/17  Lines/Hardware:  Assessment 66 year old male with PMH as below including type II DM, celiac disease, NASH cirrhosis gated by esophageal varices, portal hypertensive gastropathy a/w thrombocytopenia and leukopenia ( ANC in 10/26 1.1) who presented to the ED on 11/17 from UC with 2-day history of flulike symptoms.  ID engaged for strep salivarius bacteremia  Recommendations  Continue Ceftriaxone Follow-up repeat blood cultures from 11/19 for clearance Follow-up TTE. If TTE negative, will need TEE to r/o endocarditis  Monitor CBC and CMP, added on CBC w diff to check ANC Unclear source of infection at this time and evaluate for any identifiable source. Would get MRI of his spine if any acute on chronic worsening of back pain, vertebral tendereness etc  D/w Primary   Following remotely  Rest of the management as per the primary team. Please call with questions or concerns.  Thank you for the consult  Odette Fraction,  MD Infectious Disease Physician [facility] [address] [facility], [place] [address] Phone: [phone]  Fax: [phone]  __________________________________________________________________________________________________________ HPI and Hospital Course: 66 year old male with PMH as below including type II DM, celiac disease, NASH cirrhosis gated by esophageal varices, portal hypertensive gastropathy a/w  thrombocytopenia and leukopenia who presented to the ED on 11/17 from UC with 2-day history of flulike symptoms with fever chills body aches and nausea. Went to urgent care and was tested negative for flu and COVID.  He also had some back pain.  He was empirically treated on Tamiflu on 11/15.   At ED, febrile with Tmax 103 Labs remarkable for BG 206, AI with creatinine 1.4, WBC 0.9, platelets 19, ANC 0.5 11/17 blood cx 2/2 sets strep salivarius   ROS: unavailable at this time   Past Medical History:  Diagnosis Date   Esophageal varices (HCC)    s/p multiple bandings   Kidney stone    NASH (nonalcoholic steatohepatitis)    Spleen enlarged    Past Surgical History:  Procedure Laterality Date   ELBOW SURGERY Left    Scheduled Meds:  sodium chloride   Intravenous Once   carvedilol  6.25 mg Oral BID WC   furosemide  20 mg Oral Daily   insulin aspart  0-15 Units Subcutaneous TID WC  insulin aspart  0-5 Units Subcutaneous QHS   insulin glargine-yfgn  50 Units Subcutaneous Q2200   Continuous Infusions:  sodium chloride Stopped (02/17/22 0929)   cefTRIAXone (ROCEPHIN)  IV Stopped (02/18/22 1110)   PRN Meds:.sodium chloride, acetaminophen **OR** acetaminophen, albuterol, guaiFENesin-dextromethorphan, ondansetron **OR** ondansetron (ZOFRAN) IV  Allergies  Allergen Reactions   Gluten Meal     Other reaction(s): Other (See Comments) GI upset   Cortisone     Other reaction(s): Liver Disorder   Statins     Other reaction(s):  Other (See Comments), Other (See Comments) Liver disease contraindication Liver disease contraindication    Social History   Socioeconomic History   Marital status: Married    Spouse name: Not on file   Number of children: Not on file   Years of education: Not on file   Highest education level: Not on file  Occupational History   Not on file  Tobacco Use   Smoking status: Never   Smokeless tobacco: Never  Vaping Use   Vaping Use: Not on file  Substance and Sexual Activity   Alcohol use: Not Currently   Drug use: Never   Sexual activity: Not on file  Other Topics Concern   Not on file  Social History Narrative   Not on file   Social Determinants of Health   Financial Resource Strain: Not on file  Food Insecurity: No Food Insecurity (02/17/2022)   Hunger Vital Sign    Worried About Running Out of Food in the Last Year: Never true    Ran Out of Food in the Last Year: Never true  Transportation Needs: No Transportation Needs (02/17/2022)   PRAPARE - Administrator, Civil ServiceTransportation    Lack of Transportation (Medical): No    Lack of Transportation (Non-Medical): No  Physical Activity: Not on file  Stress: Not on file  Social Connections: Not on file  Intimate Partner Violence: Not At Risk (02/17/2022)   Humiliation, Afraid, Rape, and Kick questionnaire    Fear of Current or Ex-Partner: No    Emotionally Abused: No    Physically Abused: No    Sexually Abused: No   History reviewed. No pertinent family history.   Vitals BP 104/62 (BP Location: Right Arm)   Pulse 63   Temp 97.9 F (36.6 C)   Resp 16   Ht 6' (1.829 m)   Wt 100.1 kg   SpO2 97%   BMI 29.93 kg/m   Pertinent Microbiology Results for orders placed or performed during the hospital encounter of 02/16/22  Resp panel by RT-PCR (RSV, Flu A&B, Covid) Anterior Nasal Swab     Status: None   Collection Time: 02/16/22  5:35 PM   Specimen: Anterior Nasal Swab  Result Value Ref Range Status   SARS Coronavirus 2 by RT PCR  NEGATIVE NEGATIVE Final    Comment: (NOTE) SARS-CoV-2 target nucleic acids are NOT DETECTED.  The SARS-CoV-2 RNA is generally detectable in upper respiratory specimens during the acute phase of infection. The lowest concentration of SARS-CoV-2 viral copies this assay can detect is 138 copies/mL. A negative result does not preclude SARS-Cov-2 infection and should not be used as the sole basis for treatment or other patient management decisions. A negative result may occur with  improper specimen collection/handling, submission of specimen other than nasopharyngeal swab, presence of viral mutation(s) within the areas targeted by this assay, and inadequate number of viral copies(<138 copies/mL). A negative result must be combined with clinical observations, patient history, and epidemiological information. The expected result  is Negative.  Fact Sheet for Patients:  BloggerCourse.com  Fact Sheet for Healthcare Providers:  SeriousBroker.it  This test is no t yet approved or cleared by the [place] FDA and  has been authorized for detection and/or diagnosis of SARS-CoV-2 by FDA under an Emergency Use Authorization (EUA). This EUA will remain  in effect (meaning this test can be used) for the duration of the COVID-19 declaration under Section 564(b)(1) of the Act, 21 U.S.C.section 360bbb-3(b)(1), unless the authorization is terminated  or revoked sooner.       Influenza A by PCR NEGATIVE NEGATIVE Final   Influenza B by PCR NEGATIVE NEGATIVE Final    Comment: (NOTE) The Xpert Xpress SARS-CoV-2/FLU/RSV plus assay is intended as an aid in the diagnosis of influenza from Nasopharyngeal swab specimens and should not be used as a sole basis for treatment. Nasal washings and aspirates are unacceptable for Xpert Xpress SARS-CoV-2/FLU/RSV testing.  Fact Sheet for Patients: BloggerCourse.com  Fact Sheet for  Healthcare Providers: SeriousBroker.it  This test is not yet approved or cleared by the [place] FDA and has been authorized for detection and/or diagnosis of SARS-CoV-2 by FDA under an Emergency Use Authorization (EUA). This EUA will remain in effect (meaning this test can be used) for the duration of the COVID-19 declaration under Section 564(b)(1) of the Act, 21 U.S.C. section 360bbb-3(b)(1), unless the authorization is terminated or revoked.     Resp Syncytial Virus by PCR NEGATIVE NEGATIVE Final    Comment: (NOTE) Fact Sheet for Patients: BloggerCourse.com  Fact Sheet for Healthcare Providers: SeriousBroker.it  This test is not yet approved or cleared by the [place] FDA and has been authorized for detection and/or diagnosis of SARS-CoV-2 by FDA under an Emergency Use Authorization (EUA). This EUA will remain in effect (meaning this test can be used) for the duration of the COVID-19 declaration under Section 564(b)(1) of the Act, 21 U.S.C. section 360bbb-3(b)(1), unless the authorization is terminated or revoked.  Performed at [facility], [address] [place], [place] [address]   Blood culture (routine x 2)     Status: None (Preliminary result)   Collection Time: 02/16/22 11:59 PM   Specimen: BLOOD LEFT ARM  Result Value Ref Range Status   Specimen Description   Final    BLOOD LEFT ARM Performed at [facility], [address] [place], [place] [address]    Special Requests   Final    Blood Culture results may not be optimal due to an excessive volume of blood received in culture bottles Performed at [facility], [address], [place], [place] [address]    Culture  Setup Time   Final    GRAM POSITIVE COCCI IN BOTH AEROBIC AND ANAEROBIC BOTTLES CRITICAL RESULT CALLED TO, READ BACK BY AND VERIFIED WITH: ALEX CHAPPELL 02/17/22 1121 AMK Performed  at [facility], [address], [place], [place] [address]    Culture   Final    Romie Minus POSITIVE COCCI IDENTIFICATION TO FOLLOW Performed at [facility], [address] [place], [place] [address]    Report Status PENDING  Incomplete  Blood culture (routine x 2)     Status: None (Preliminary result)   Collection Time: 02/16/22 11:59 PM   Specimen: BLOOD RIGHT ARM  Result Value Ref Range Status   Specimen Description   Final    BLOOD RIGHT ARM Performed at [facility], [address] [place], [place] [address]    Special Requests  Final    Blood Culture adequate volume Performed at [facility], [address] [place], [place] [address]    Culture  Setup Time   Final    GRAM POSITIVE COCCI IN BOTH AEROBIC AND ANAEROBIC BOTTLES CRITICAL RESULT CALLED TO, READ BACK BY AND VERIFIED WITH: ALEX CHAPPELL 02/17/22 1121 AMK    Culture   Final    GRAM POSITIVE COCCI IDENTIFICATION AND SUSCEPTIBILITIES TO FOLLOW Performed at [facility], [address] [place], [place] [address]    Report Status PENDING  Incomplete  Blood Culture ID Panel (Reflexed)     Status: Abnormal   Collection Time: 02/16/22 11:59 PM  Result Value Ref Range Status   Enterococcus faecalis NOT DETECTED NOT DETECTED Final   Enterococcus Faecium NOT DETECTED NOT DETECTED Final   Listeria monocytogenes NOT DETECTED NOT DETECTED Final   Staphylococcus species NOT DETECTED NOT DETECTED Final   Staphylococcus aureus (BCID) NOT DETECTED NOT DETECTED Final   Staphylococcus epidermidis NOT DETECTED NOT DETECTED Final   Staphylococcus lugdunensis NOT DETECTED NOT DETECTED Final   Streptococcus species DETECTED (A) NOT DETECTED Final    Comment: Not Enterococcus species, Streptococcus agalactiae, Streptococcus pyogenes, or Streptococcus pneumoniae. CRITICAL RESULT CALLED TO, READ BACK BY AND VERIFIED WITH: ALEX CHAPPELL 02/17/22 1121 AMK    Streptococcus agalactiae NOT  DETECTED NOT DETECTED Final   Streptococcus pneumoniae NOT DETECTED NOT DETECTED Final   Streptococcus pyogenes NOT DETECTED NOT DETECTED Final   A.calcoaceticus-baumannii NOT DETECTED NOT DETECTED Final   Bacteroides fragilis NOT DETECTED NOT DETECTED Final   Enterobacterales NOT DETECTED NOT DETECTED Final   Enterobacter cloacae complex NOT DETECTED NOT DETECTED Final   Escherichia coli NOT DETECTED NOT DETECTED Final   Klebsiella aerogenes NOT DETECTED NOT DETECTED Final   Klebsiella oxytoca NOT DETECTED NOT DETECTED Final   Klebsiella pneumoniae NOT DETECTED NOT DETECTED Final   Proteus species NOT DETECTED NOT DETECTED Final   Salmonella species NOT DETECTED NOT DETECTED Final   Serratia marcescens NOT DETECTED NOT DETECTED Final   Haemophilus influenzae NOT DETECTED NOT DETECTED Final   Neisseria meningitidis NOT DETECTED NOT DETECTED Final   Pseudomonas aeruginosa NOT DETECTED NOT DETECTED Final   Stenotrophomonas maltophilia NOT DETECTED NOT DETECTED Final   Candida albicans NOT DETECTED NOT DETECTED Final   Candida auris NOT DETECTED NOT DETECTED Final   Candida glabrata NOT DETECTED NOT DETECTED Final   Candida krusei NOT DETECTED NOT DETECTED Final   Candida parapsilosis NOT DETECTED NOT DETECTED Final   Candida tropicalis NOT DETECTED NOT DETECTED Final   Cryptococcus neoformans/gattii NOT DETECTED NOT DETECTED Final    Comment: Performed at [facility], [address] [place], [place] [address]  MRSA Next Gen by PCR, Nasal     Status: None   Collection Time: 02/17/22  8:37 AM   Specimen: Nasal Mucosa; Nasal Swab  Result Value Ref Range Status   MRSA by PCR Next Gen NOT DETECTED NOT DETECTED Final    Comment: (NOTE) The GeneXpert MRSA Assay (FDA approved for NASAL specimens only), is one component of a comprehensive MRSA colonization surveillance program. It is not intended to diagnose MRSA infection nor to guide or monitor treatment for MRSA  infections. Test performance is not FDA approved in patients less than 4 years old. Performed at [facility], [address] [place], [place] [address]   Respiratory (~20 pathogens) panel by PCR     Status: None   Collection Time: 02/17/22  8:37  AM   Specimen: Nasopharyngeal Swab; Respiratory  Result Value Ref Range Status   Adenovirus NOT DETECTED NOT DETECTED Final   Coronavirus 229E NOT DETECTED NOT DETECTED Final    Comment: (NOTE) The Coronavirus on the Respiratory Panel, DOES NOT test for the novel  Coronavirus (2019 nCoV)    Coronavirus HKU1 NOT DETECTED NOT DETECTED Final   Coronavirus NL63 NOT DETECTED NOT DETECTED Final   Coronavirus OC43 NOT DETECTED NOT DETECTED Final   Metapneumovirus NOT DETECTED NOT DETECTED Final   Rhinovirus / Enterovirus NOT DETECTED NOT DETECTED Final   Influenza A NOT DETECTED NOT DETECTED Final   Influenza B NOT DETECTED NOT DETECTED Final   Parainfluenza Virus 1 NOT DETECTED NOT DETECTED Final   Parainfluenza Virus 2 NOT DETECTED NOT DETECTED Final   Parainfluenza Virus 3 NOT DETECTED NOT DETECTED Final   Parainfluenza Virus 4 NOT DETECTED NOT DETECTED Final   Respiratory Syncytial Virus NOT DETECTED NOT DETECTED Final   Bordetella pertussis NOT DETECTED NOT DETECTED Final   Bordetella Parapertussis NOT DETECTED NOT DETECTED Final   Chlamydophila pneumoniae NOT DETECTED NOT DETECTED Final   Mycoplasma pneumoniae NOT DETECTED NOT DETECTED Final    Comment: Performed at [facility], [address] [place], [place] [address]  Culture, blood (Routine X 2) w Reflex to ID Panel     Status: None (Preliminary result)   Collection Time: 02/18/22 12:23 PM   Specimen: BLOOD LEFT HAND  Result Value Ref Range Status   Specimen Description BLOOD LEFT HAND  Final   Special Requests   Final    BOTTLES DRAWN AEROBIC AND ANAEROBIC Blood Culture adequate volume   Culture   Final    NO GROWTH < 24 HOURS Performed at [facility], [address] [place], [place] [address]    Report Status PENDING  Incomplete  Culture, blood (Routine X 2) w Reflex to ID Panel     Status: None (Preliminary result)   Collection Time: 02/18/22 12:30 PM   Specimen: BLOOD RIGHT HAND  Result Value Ref Range Status   Specimen Description BLOOD RIGHT HAND  Final   Special Requests   Final    BOTTLES DRAWN AEROBIC AND ANAEROBIC Blood Culture adequate volume   Culture   Final    NO GROWTH < 24 HOURS Performed at [facility], [address] [place], [place] [address]    Report Status PENDING  Incomplete   Pertinent Lab seen by me:    Latest Ref Rng & Units 02/19/2022    5:36 AM 02/18/2022    5:25 AM 02/17/2022    5:33 AM  CBC  WBC 4.0 - 10.5 K/uL 1.4  1.1  0.8   Hemoglobin 13.0 - 17.0 g/dL 02.7  25.3  66.4   Hematocrit 39.0 - 52.0 % 34.9  33.6  33.1   Platelets 150 - 400 K/uL 17  17  18        Latest Ref Rng & Units 02/19/2022    5:36 AM 02/18/2022    5:25 AM 02/17/2022    5:33 AM  CMP  Glucose 70 - 99 mg/dL 02/19/2022   403   BUN 8 - 23 mg/dL 23   18   Creatinine 474 - 1.24 mg/dL 2.59  5.63  8.75   Sodium 135 - 145 mmol/L 138   135   Potassium 3.5 - 5.1 mmol/L 3.9   3.7   Chloride 98 - 111 mmol/L 109   110  CO2 22 - 32 mmol/L 24   20   Calcium 8.9 - 10.3 mg/dL 8.7   7.9     Pertinent Imagings/Other Imagings Plain films and CT images have been personally visualized and interpreted; radiology reports have been reviewed. Decision making incorporated into the Impression / Recommendations.  CT Angio Chest PE W and/or Wo Contrast  Result Date: 02/16/2022 CLINICAL DATA:  Shortness of breath, fever EXAM: CT ANGIOGRAPHY CHEST WITH CONTRAST TECHNIQUE: Multidetector CT imaging of the chest was performed using the standard protocol during bolus administration of intravenous contrast. Multiplanar CT image reconstructions and MIPs were obtained to evaluate the vascular anatomy. RADIATION DOSE REDUCTION: This exam was  performed according to the departmental dose-optimization program which includes automated exposure control, adjustment of the mA and/or kV according to patient size and/or use of iterative reconstruction technique. CONTRAST:  80mL OMNIPAQUE IOHEXOL 350 MG/ML SOLN COMPARISON:  Abdominal CT 02/14/2022 FINDINGS: Cardiovascular: No filling defects in the pulmonary arteries to suggest pulmonary emboli. Cardiomegaly. Scattered coronary artery and aortic calcifications. No evidence of aortic aneurysm or dissection. Mediastinum/Nodes: No mediastinal, hilar, or axillary adenopathy. Trachea and esophagus are unremarkable. Thyroid unremarkable. Lungs/Pleura: Linear atelectasis in the lung bases.  No effusions. Upper Abdomen: Splenomegaly as seen on recent abdominal CT. Changes of cirrhosis. Upper abdominal varices noted. Findings unchanged since recent abdominal CT. Musculoskeletal: Chest wall soft tissues are unremarkable. No acute bony abnormality. Review of the MIP images confirms the above findings. IMPRESSION: No evidence of pulmonary embolus. Bibasilar atelectasis. Scattered coronary artery calcifications. Cirrhosis with splenomegaly and upper abdominal varices, unchanged since recent abdominal CT. Aortic Atherosclerosis (ICD10-I70.0). Electronically Signed   By: Charlett Nose M.D.   On: 02/16/2022 21:26   DG Chest 2 View  Result Date: 02/16/2022 CLINICAL DATA:  Chest pain.  Shortness of breath.  Fever and chills. EXAM: CHEST - 2 VIEW COMPARISON:  None Available. FINDINGS: The heart size and mediastinal contours are within normal limits. Azygous fissure is incidentally noted. Bandlike opacities are seen in the left lower lung which may be due to scarring or atelectasis. No evidence of pulmonary consolidation or edema. No evidence of pleural effusion. IMPRESSION: Left lower lung scarring versus atelectasis. Electronically Signed   By: Danae Orleans M.D.   On: 02/16/2022 18:04   CT RENAL STONE STUDY  Result Date:  02/14/2022 CLINICAL DATA:  History of flank pain without urinary symptoms in a 66 year old EXAM: CT ABDOMEN AND PELVIS WITHOUT CONTRAST TECHNIQUE: Multidetector CT imaging of the abdomen and pelvis was performed following the standard protocol without IV contrast. RADIATION DOSE REDUCTION: This exam was performed according to the departmental dose-optimization program which includes automated exposure control, adjustment of the mA and/or kV according to patient size and/or use of iterative reconstruction technique. COMPARISON:  Aug 02, 2020 FINDINGS: Lower chest: Basilar atelectasis. No effusion. No consolidative changes. Heart size is normal without pericardial effusion. There is no chest wall abnormality. Hepatobiliary: Lobular hepatic contours compatible with cirrhosis with marked fissural widening of hepatic fissures. No gross biliary duct distension following cholecystectomy. Pancreas: Pancreas with normal contours, no signs of inflammation or peripancreatic fluid. Spleen: Massive splenomegaly measuring 20 x 13 x 25 (volume = 3400 cc) cm as compared to 21 x 10 x 23 (volume = 2500 cc. No stranding adjacent to the spleen. Homogeneous splenic attenuation.) Cm Adrenals/Urinary Tract: Smooth renal contours. Mild perinephric stranding. No nephrolithiasis, hydronephrosis or substantial perivesical stranding at this time. Normal adrenal glands. Stomach/Bowel: Varices adjacent to the distal esophagus at about the  stomach. No acute gastrointestinal findings. Appendix not visualized. No secondary signs to suggest acute appendiceal process however. Vascular/Lymphatic: Subtle calcifications of the portal vein and splenic vein along the wall of these veins tracking also into the SMV also seen on previous imaging. No aortic dilation. Portosystemic collaterals about the abdomen which are similar to the previous study. No adenopathy in the abdomen or in the pelvis. Reproductive: Unremarkable by CT. Other: No ascites  Musculoskeletal: No acute bone finding. No destructive bone process. Spinal degenerative changes. IMPRESSION: 1. No nephrolithiasis, hydronephrosis or substantial perivesical stranding at this time. 2. Massive splenomegaly with increase in measured volume, proximally 30% increase. This could explain LEFT-sided flank pain. Based on splenic size complication such as splenic rupture can be a concern though there are no current secondary signs that would suggest this possibility. If symptoms worsen consider further evaluation with contrasted imaging. 3. Signs of cirrhosis and portal hypertension as before. 4. Subtle calcifications of the portal vein and splenic vein along the wall of these veins tracking also into the SMV also seen on previous imaging. Findings are nonspecific but can be seen in the setting of chronic portal vein thrombosis. These results will be called to the ordering clinician or representative by the Radiologist Assistant, and communication documented in the PACS or [employer]. Electronically Signed   By: Donzetta Kohut M.D.   On: 02/14/2022 16:25    I spent 80 minutes for this patient encounter including review of prior medical records/discussing diagnostics and treatment plan with the patient/family/coordinate care with primary/other specialits with greater than 50% of time in face to face encounter.   Electronically signed by:   Odette Fraction, MD Infectious Disease Physician [facility] Pager: [phone]

## 2022-02-23 LAB — CULTURE, BLOOD (ROUTINE X 2)
Culture: NO GROWTH
Culture: NO GROWTH
Special Requests: ADEQUATE
Special Requests: ADEQUATE

## 2022-04-11 ENCOUNTER — Ambulatory Visit: Payer: 59 | Admitting: Urology

## 2022-04-16 ENCOUNTER — Ambulatory Visit: Payer: 59 | Admitting: Urology

## 2022-04-19 ENCOUNTER — Ambulatory Visit: Payer: 59 | Admitting: Urology

## 2022-04-26 ENCOUNTER — Ambulatory Visit: Payer: 59 | Admitting: Urology

## 2022-05-07 ENCOUNTER — Ambulatory Visit
Admission: EM | Admit: 2022-05-07 | Discharge: 2022-05-07 | Disposition: A | Discharge status: Home / Self Care | Payer: 59

## 2022-05-07 DIAGNOSIS — L03811 Cellulitis of head [any part, except face]: Secondary | ICD-10-CM | POA: Diagnosis not present

## 2022-05-07 MED ORDER — DOXYCYCLINE HYCLATE 100 MG PO CAPS: 100.0000 mg | ORAL_CAPSULE | Freq: Two times a day (BID) | ORAL | 0 refills | Status: DC

## 2022-05-07 NOTE — ED Provider Notes (Signed)
[facility]    CSN: [id number] Arrival date & time: 05/07/22  1652      History   Chief Complaint No chief complaint on file.   HPI Eugene Cameron is a 67 y.o. male.   HPI  67 year old male here for evaluation of scalp soreness.  Patient reports that he noticed a sore spot on the backside of his head behind his left ear 3 days ago.  He is describes it as having a throbbing ache and today he started to have pain going down the left side of his neck.  The area is warm to touch.  He has not had any fever.  He states that he has been experiencing bloody drainage from the area.  The patient is on the liver transplant list.  Past Medical History:  Diagnosis Date   Esophageal varices ([place])    s/p multiple bandings   Kidney stone    NASH (nonalcoholic steatohepatitis)    Spleen enlarged     Patient Active Problem List   Diagnosis Date Noted   Fever 02/19/2022   Streptococcal bacteremia 02/19/2022   Flu-like symptoms 02/17/2022   Leukopenia 02/17/2022   Esophageal varices ([place]) 02/17/2022   Neutropenic fever ([place]) 02/16/2022   Stage 3a chronic kidney disease ([place]) 02/16/2022   Joint swelling 01/25/2022   Kidney stone    Hematochezia 06/05/2020   Liver cirrhosis secondary to NASH (nonalcoholic steatohepatitis) ([place]) 06/05/2020   Left lateral epicondylitis 05/24/2020   Splenomegaly 11/11/2017   Chronic abdominal pain 03/22/2016   Arthritis of right acromioclavicular joint 08/19/2015   Insomnia 06/16/2015   Dupuytren's disease of palm 04/22/2015   Myofascial pain 11/16/2014   Adhesive capsulitis of right shoulder 06/30/2014   Hypertension associated with diabetes ([place]) 09/11/2013   Cirrhosis, non-alcoholic ([place]) 70/62/3762   Hemorrhoidal skin tag 05/19/2012   Erectile dysfunction 11/27/2011   Gastric ulcer 11/05/2011   Lumbago due to displacement of intervertebral disc 11/05/2011   Hiatal hernia 11/05/2011   Acute on chronic thrombocytopenia ([place])  11/05/2011   Portal hypertensive gastropathy ([place]) 11/05/2011   Obesity 10/31/2011   Callus 10/11/2011   Uncontrolled type 2 diabetes mellitus with hyperglycemia, with long-term current use of insulin ([place]) 09/05/2011    Past Surgical History:  Procedure Laterality Date   ELBOW SURGERY Left        Home Medications    Prior to Admission medications   Medication Sig Start Date End Date Taking? Authorizing Provider  doxycycline (VIBRAMYCIN) 100 MG capsule Take 1 capsule (100 mg total) by mouth 2 (two) times daily. 05/07/22  Yes Margarette Canada, NP  5-Hydroxytryptophan 50 MG CAPS Take by mouth.    [provider]  ACCU-CHEK GUIDE test strip in the morning, at noon, and at bedtime. 07/12/20   [provider]  ascorbic acid (VITAMIN C) 500 MG tablet Take by mouth.    [provider]  BD PEN NEEDLE NANO 2ND GEN 32G X 4 MM MISC daily. 06/23/20   [provider]  carvedilol (COREG) 6.25 MG tablet Take 1/2 tablet in morning, 1 tablet in evening 05/02/20   [provider]  Continuous Blood Gluc Sensor (DEXCOM G7 SENSOR) MISC USE 1 EACH EVERY 10 (TEN) DAYS 09/01/21   [provider]  DOPTELET 20 MG TABS Take by mouth. 02/13/22   [provider]  FARXIGA 5 MG TABS tablet Take 5 mg by mouth daily. 07/28/20   [provider]  fluticasone (FLONASE) 50 MCG/ACT nasal spray Place 2 sprays  into the nose as needed. 07/13/19   [provider]  furosemide (LASIX) 20 MG tablet Take 20 mg by mouth daily. 06/10/20   [provider]  gabapentin (NEURONTIN) 100 MG capsule Take 300 mg by mouth at bedtime. 05/02/20   [provider]  glipiZIDE (GLUCOTROL) 10 MG tablet Take 10 mg by mouth 2 (two) times daily. 05/15/20   [provider]  hydroxychloroquine (PLAQUENIL) 200 MG tablet Take by mouth.    [provider]  Lactobacillus Rhamnosus, GG, (RA PROBIOTIC DIGESTIVE CARE) CAPS Take 1 capsule by mouth daily.     [provider]  metFORMIN (GLUCOPHAGE) 500 MG tablet Take by mouth.    [provider]  ondansetron (ZOFRAN-ODT) 4 MG disintegrating tablet Take 1 tablet (4 mg total) by mouth every 8 (eight) hours as needed for nausea or vomiting. 03/17/21   Vaillancourt, Aldona Bar, PA-C  pantoprazole (PROTONIX) 40 MG tablet Take 1 tablet by mouth daily. 07/21/20   [provider]  pioglitazone (ACTOS) 30 MG tablet Take 30 mg by mouth daily. 05/15/20   [provider]  tamsulosin (FLOMAX) 0.4 MG CAPS capsule Take 1 tablet by mouth daily until you pass the kidney stone or no longer have symptoms 03/17/21   Vaillancourt, Samantha, PA-C  TRESIBA FLEXTOUCH 100 UNIT/ML FlexTouch Pen Inject 50 Units into the skin at bedtime. 07/11/20   [provider]    Family History History reviewed. No pertinent family history.  Social History Social History   Tobacco Use   Smoking status: Never   Smokeless tobacco: Never  Substance Use Topics   Alcohol use: Not Currently   Drug use: Never     Allergies   Gluten meal, Cortisone, and Statins   Review of Systems Review of Systems  Constitutional:  Negative for fever.  Skin:  Positive for color change and wound.  Hematological:  Positive for adenopathy.     Physical Exam Triage Vital Signs ED Triage Vitals  Enc Vitals Group     BP      Pulse      Resp      Temp      Temp src      SpO2      Weight      Height      Head Circumference      Peak Flow      Pain Score      Pain Loc      Pain Edu?      Excl. in [place]?    No data found.  Updated Vital Signs BP 133/67 (BP Location: Left Arm)   Pulse 68   Temp 97.7 F (36.5 C) (Oral)   Ht 6' (1.829 m)   Wt 215 lb (97.5 kg)   SpO2 100%   BMI 29.16 kg/m   Visual Acuity Right Eye Distance:   Left Eye Distance:   Bilateral Distance:    Right Eye Near:   Left Eye Near:    Bilateral Near:     Physical Exam Vitals and nursing note reviewed.   Constitutional:      Appearance: Normal appearance. He is not ill-appearing.  HENT:     Head: Normocephalic and atraumatic.  Neck:     Comments: Left posterior cervical lymphadenopathy present on exam. Lymphadenopathy:     Cervical: Cervical adenopathy present.  Skin:    General: Skin is warm and dry.     Capillary Refill: Capillary refill takes less than 2 seconds.  Findings: Erythema and lesion present.  Neurological:     General: No focal deficit present.     Mental Status: He is alert and oriented to person, place, and time.  Psychiatric:        Mood and Affect: Mood normal.        Behavior: Behavior normal.        Thought Content: Thought content normal.        Judgment: Judgment normal.      UC Treatments / Results  Labs (all labs ordered are listed, but only abnormal results are displayed) Labs Reviewed - No data to display  EKG   Radiology No results found.  Procedures Procedures (including critical care time)  Medications Ordered in UC Medications - No data to display  Initial Impression / Assessment and Plan / UC Course  I have reviewed the triage vital signs and the nursing notes.  Pertinent labs & imaging results that were available during my care of the patient were reviewed by me and considered in my medical decision making (see chart for details).   Patient is a pleasant 67 year old male who is on the liver transplant list presenting for evaluation of a scalp sore that has been present for last 3 days and is draining a bloody drainage.  No fever but he is complaining of pain in the left side of his neck.  On exam patient has an open lesion on the left posterior occipital parietal area.  Patient is seen image above the lesion is approximately the size of a quarter and there is an open tract in the middle.  The surrounding erythematous tissue is also indurated.  No fluctuance noted.  Patient is currently on the liver transplant list and he has a CMP  in epic from 04/24/2022 that shows normal transaminases.  CBC from 04/26/2022 shows an overall white blood cell count of 1.7.  I will treat the patient for a scalp abscess and cellulitis with doxycycline 100 mg twice daily for 10 days.  I have advised him to apply warm compresses to the area to see if he can get the remaining infection locked in the tissues to drain through the open tract.  If he has any increase in pain, swelling, redness, or develops a fever he needs to go to the ER for evaluation.  Final Clinical Impressions(s) / UC Diagnoses   Final diagnoses:  Abscess or cellulitis of scalp     Discharge Instructions      Take the Doxycycline twice daily with food for 10 days.  Doxycycline will make you more sensitive to sunburn so wear sunscreen when outdoors and reapply it every 90 minutes.  Apply warm compresses to help promote drainage.  Use OTC Tylenol and Ibuprofen according to the package instructions as needed for pain.  Return for new or worsening symptoms.  If you have any increased redness, swelling, develop puslike drainage, or fever you need to go to the ER for evaluation.     ED Prescriptions     Medication Sig Dispense Auth. Provider   doxycycline (VIBRAMYCIN) 100 MG capsule Take 1 capsule (100 mg total) by mouth 2 (two) times daily. 20 capsule Margarette Canada, NP      PDMP not reviewed this encounter.   Margarette Canada, NP 05/07/22 [phone]

## 2022-05-07 NOTE — ED Triage Notes (Addendum)
Pt reports sore spot back of head, behind LT ear, pt states he noticed this x3 days ago, throbbing ache. Pt has been taking tylenol & ibuprofen, denies any fevers. Pt unsure if area is warm to the touch he has not touched area

## 2022-05-07 NOTE — Discharge Instructions (Addendum)
Take the Doxycycline twice daily with food for 10 days.  Doxycycline will make you more sensitive to sunburn so wear sunscreen when outdoors and reapply it every 90 minutes.  Apply warm compresses to help promote drainage.  Use OTC Tylenol and Ibuprofen according to the package instructions as needed for pain.  Return for new or worsening symptoms.  If you have any increased redness, swelling, develop puslike drainage, or fever you need to go to the ER for evaluation.

## 2022-05-09 ENCOUNTER — Ambulatory Visit: Payer: 59 | Admitting: Urology

## 2023-01-19 IMAGING — CR DG ABDOMEN 1V
1 series · 2 of 2 positions shown · non-contrast
Comparison: August 02, 2020

CLINICAL DATA: Right-sided flank pain.

EXAM:
ABDOMEN - 1 VIEW

[Series 1: dg abd 1 view · 0.14mm/px · 2 of 2 slices shown]
[im 1/2]
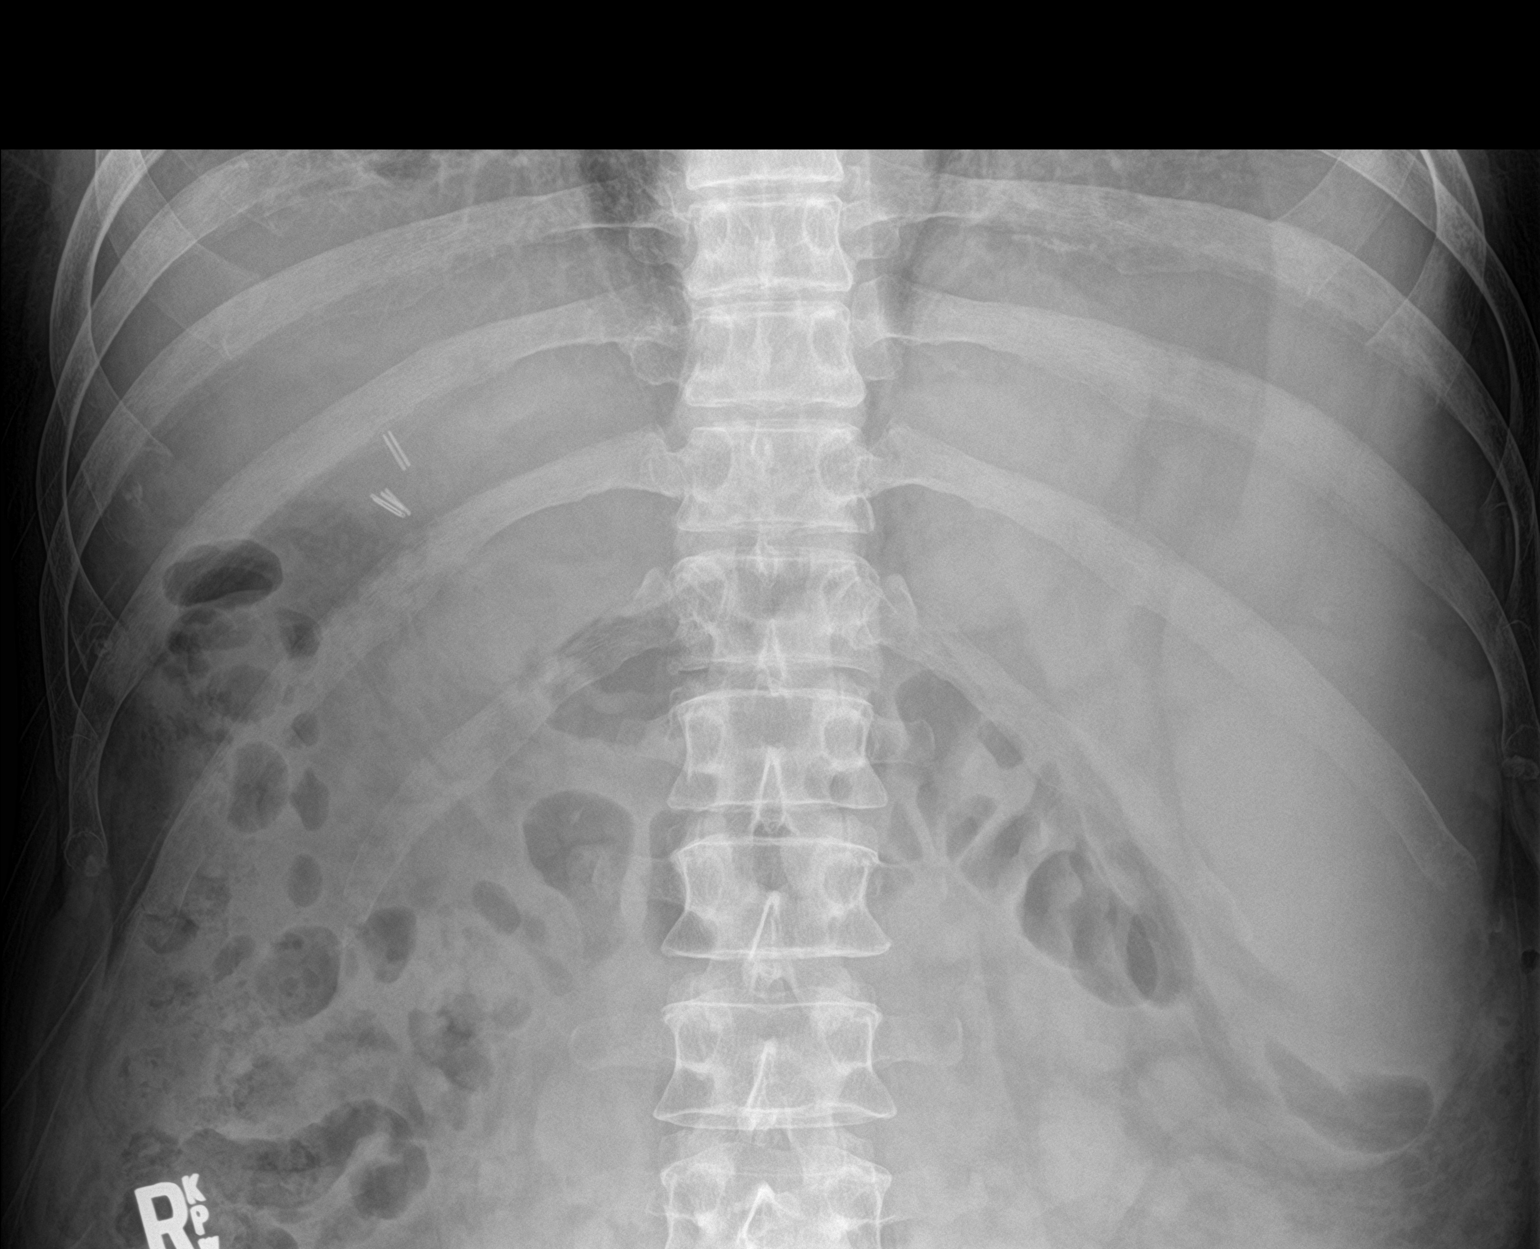
[im 2/2]
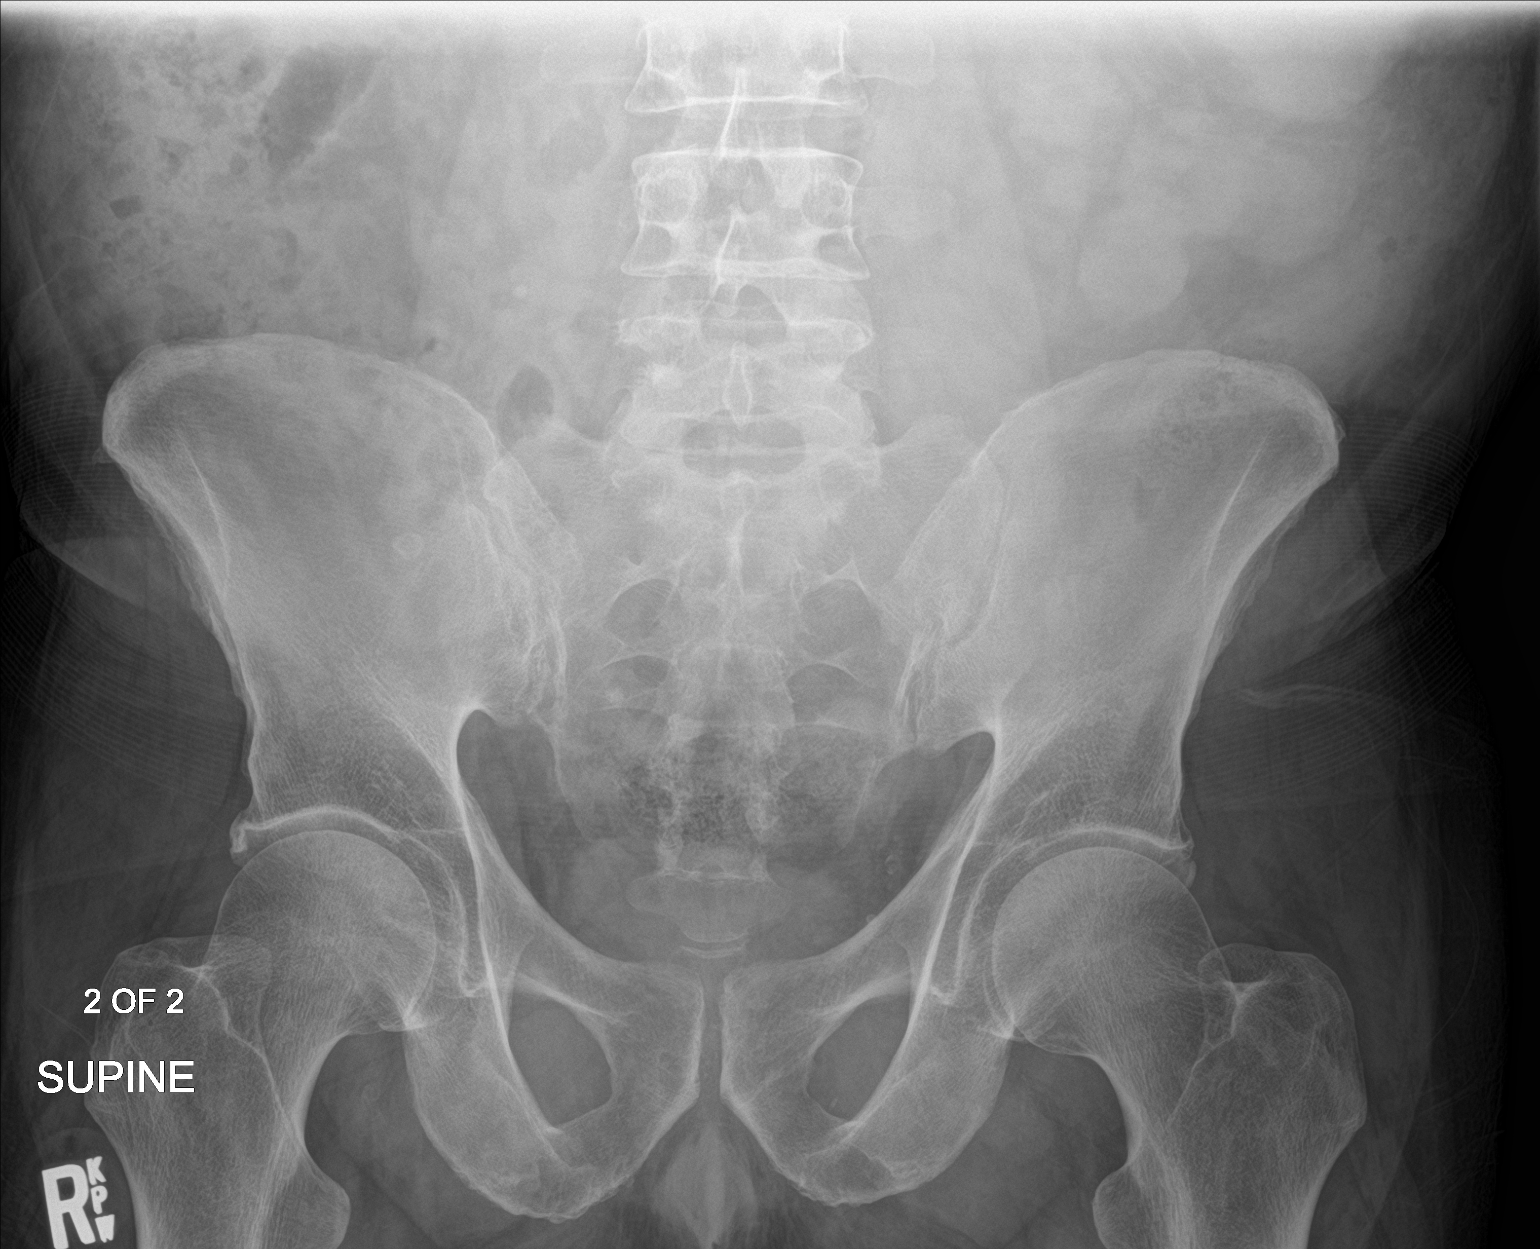

[2 of 2 positions shown; findings below may reference images not displayed]

FINDINGS: The bowel gas pattern is normal. A stable 3 mm soft tissue
calcification is seen projecting over the lower pole of the right
kidney. Radiopaque surgical clips are seen overlying the right upper
quadrant.
IMPRESSION: 1. Stable 3 mm right renal calculus.
2. No evidence of bowel obstruction.

## 2023-03-17 ENCOUNTER — Encounter: Payer: Self-pay | Admitting: Emergency Medicine

## 2023-03-17 ENCOUNTER — Ambulatory Visit
Admission: EM | Admit: 2023-03-17 | Discharge: 2023-03-17 | Disposition: A | Discharge status: Home / Self Care | Payer: 59 | Attending: Family Medicine | Admitting: Family Medicine

## 2023-03-17 DIAGNOSIS — R21 Rash and other nonspecific skin eruption: Secondary | ICD-10-CM

## 2023-03-17 MED ORDER — TRIAMCINOLONE ACETONIDE 0.1 % EX OINT: 1.0000 | TOPICAL_OINTMENT | Freq: Two times a day (BID) | CUTANEOUS | 0 refills | Status: AC

## 2023-03-17 MED ORDER — DOXYCYCLINE HYCLATE 100 MG PO CAPS: 100.0000 mg | ORAL_CAPSULE | Freq: Two times a day (BID) | ORAL | 0 refills | Status: AC

## 2023-03-17 NOTE — ED Triage Notes (Signed)
Patient states that he noticed a painful red rash on his left lower arm just below his elbow on Thursday.  Patient states that it is now causing pain in his left elbow.

## 2023-03-17 NOTE — Discharge Instructions (Addendum)
Stop by the pharmacy to pick up your prescriptions.  Follow up with your primary care provider or return to urgent care if not improving.

## 2023-03-17 NOTE — ED Provider Notes (Signed)
[facility]    CSN: [id number] Arrival date & time: 03/17/23  0919      History   Chief Complaint Chief Complaint  Patient presents with   Rash    HPI URI Eugene Cameron is a 67 y.o. male.   HPI  Eugene Cameron presents for non- itchy red rash that burns.  Rash started on Thursday.  Took some Tylenol for pain without relief. He got his Shingles vaccine (shingrix) in 2019.    He is on the national liver transplant list.     Past Medical History:  Diagnosis Date   Esophageal varices ([facility])    s/p multiple bandings   Kidney stone    NASH (nonalcoholic steatohepatitis)    Spleen enlarged     Patient Active Problem List   Diagnosis Date Noted   Fever 02/19/2022   Streptococcal bacteremia 02/19/2022   Flu-like symptoms 02/17/2022   Leukopenia 02/17/2022   Esophageal varices ([facility]) 02/17/2022   Neutropenic fever ([facility]) 02/16/2022   Stage 3a chronic kidney disease ([facility]) 02/16/2022   Joint swelling 01/25/2022   Kidney stone    Hematochezia 06/05/2020   Liver cirrhosis secondary to NASH (nonalcoholic steatohepatitis) ([facility]) 06/05/2020   Left lateral epicondylitis 05/24/2020   Splenomegaly 11/11/2017   Chronic abdominal pain 03/22/2016   Arthritis of right acromioclavicular joint 08/19/2015   Insomnia 06/16/2015   Dupuytren's disease of palm 04/22/2015   Myofascial pain 11/16/2014   Adhesive capsulitis of right shoulder 06/30/2014   Hypertension associated with diabetes ([facility]) 09/11/2013   Cirrhosis, non-alcoholic ([facility]) 06/11/2012   Hemorrhoidal skin tag 05/19/2012   Erectile dysfunction 11/27/2011   Gastric ulcer 11/05/2011   Lumbago due to displacement of intervertebral disc 11/05/2011   Hiatal hernia 11/05/2011   Acute on chronic thrombocytopenia ([facility]) 11/05/2011   Portal hypertensive gastropathy ([facility]) 11/05/2011   Obesity 10/31/2011   Callus 10/11/2011   Uncontrolled type 2 diabetes mellitus with hyperglycemia, with long-term current use of insulin ([facility])  09/05/2011    Past Surgical History:  Procedure Laterality Date   ELBOW SURGERY Left        Home Medications    Prior to Admission medications   Medication Sig Start Date End Date Taking? Authorizing Provider  doxycycline (VIBRAMYCIN) 100 MG capsule Take 1 capsule (100 mg total) by mouth 2 (two) times daily. 03/17/23  Yes Remy Dia, DO  FARXIGA 5 MG TABS tablet Take 5 mg by mouth daily. 07/28/20  Yes [provider]  furosemide (LASIX) 20 MG tablet Take 20 mg by mouth daily. 06/10/20  Yes [provider]  gabapentin (NEURONTIN) 100 MG capsule Take 300 mg by mouth at bedtime. 05/02/20  Yes [provider]  glipiZIDE (GLUCOTROL) 10 MG tablet Take 10 mg by mouth 2 (two) times daily. 05/15/20  Yes [provider]  metFORMIN (GLUCOPHAGE) 500 MG tablet Take by mouth.   Yes [provider]  pantoprazole (PROTONIX) 40 MG tablet Take 1 tablet by mouth daily. 07/21/20  Yes [provider]  pioglitazone (ACTOS) 30 MG tablet Take 30 mg by mouth daily. 05/15/20  Yes [provider]  TRESIBA FLEXTOUCH 100 UNIT/ML FlexTouch Pen Inject 50 Units into the skin at bedtime. 07/11/20  Yes [provider]  triamcinolone ointment (KENALOG) 0.1 % Apply 1 Application topically 2 (two) times daily. 03/17/23  Yes Abigial Newville, DO  5-Hydroxytryptophan 50 MG CAPS Take by mouth.    [provider]  ACCU-CHEK GUIDE test strip in the morning, at noon, and at bedtime. 07/12/20  [provider]  ascorbic acid (VITAMIN C) 500 MG tablet Take by mouth.    [provider]  BD PEN NEEDLE NANO 2ND GEN 32G X 4 MM MISC daily. 06/23/20   [provider]  carvedilol (COREG) 6.25 MG tablet Take 1/2 tablet in morning, 1 tablet in evening 05/02/20   [provider]  Continuous Blood Gluc Sensor (DEXCOM G7 SENSOR) MISC USE 1 EACH EVERY 10 (TEN) DAYS 09/01/21   [provider]  DOPTELET 20 MG TABS Take by mouth.  02/13/22   [provider]  fluticasone (FLONASE) 50 MCG/ACT nasal spray Place 2 sprays into the nose as needed. 07/13/19   [provider]  hydroxychloroquine (PLAQUENIL) 200 MG tablet Take by mouth.    [provider]  Lactobacillus Rhamnosus, GG, (RA PROBIOTIC DIGESTIVE CARE) CAPS Take 1 capsule by mouth daily.    [provider]  ondansetron (ZOFRAN-ODT) 4 MG disintegrating tablet Take 1 tablet (4 mg total) by mouth every 8 (eight) hours as needed for nausea or vomiting. 03/17/21   Carman Ching, PA-C  tamsulosin (FLOMAX) 0.4 MG CAPS capsule Take 1 tablet by mouth daily until you pass the kidney stone or no longer have symptoms 03/17/21   Carman Ching, PA-C    Family History History reviewed. No pertinent family history.  Social History Social History   Tobacco Use   Smoking status: Never   Smokeless tobacco: Never  Substance Use Topics   Alcohol use: Not Currently   Drug use: Never     Allergies   Gluten meal, Cortisone, and Statins   Review of Systems Review of Systems :negative unless otherwise stated in HPI.      Physical Exam Triage Vital Signs ED Triage Vitals  Encounter Vitals Group     BP 03/17/23 1004 138/68     Systolic BP Percentile --      Diastolic BP Percentile --      Pulse Rate 03/17/23 1004 64     Resp 03/17/23 1004 15     Temp 03/17/23 1004 98.1 F (36.7 C)     Temp Source 03/17/23 1004 Oral     SpO2 03/17/23 1004 98 %     Weight 03/17/23 1002 215 lb (97.5 kg)     Height 03/17/23 1002 6' (1.829 m)     Head Circumference --      Peak Flow --      Pain Score 03/17/23 1002 6     Pain Loc --      Pain Education --      Exclude from Growth Chart --    No data found.  Updated Vital Signs BP 138/68 (BP Location: Left Arm)   Pulse 64   Temp 98.1 F (36.7 C) (Oral)   Resp 15   Ht 6' (1.829 m)   Wt 97.5 kg   SpO2 98%   BMI 29.16 kg/m   Visual Acuity Right Eye Distance:   Left Eye  Distance:   Bilateral Distance:    Right Eye Near:   Left Eye Near:    Bilateral Near:     Physical Exam  GEN: well appearing male in no acute distress  CVS: well perfused  RESP: speaking in full sentences without pause, no respiratory distress  SKIN: Erythematous patch on left forearm near the elbow that is warm to touch with papules/ questionable blisters    UC Treatments / Results  Labs (all labs ordered are listed, but only abnormal results are displayed)  Labs Reviewed - No data to display  EKG   Radiology No results found.  Procedures Procedures (including critical care time)  Medications Ordered in UC Medications - No data to display  Initial Impression / Assessment and Plan / UC Course  I have reviewed the triage vital signs and the nursing notes.  Pertinent labs & imaging results that were available during my care of the patient were reviewed by me and considered in my medical decision making (see chart for details).     Patient is a 67 y.o. malewho presents for rash.  Overall, patient is well-appearing and well-hydrated.  Vital signs stable.  Eugene Cameron is afebrile.  DDx cellulitis, contact dermatitis vs shingles.  Treat with doxycycline for cellulitis and short course steroid ointment for contact dermatitis given his liver transplant status. He had 2 doses of Shingrix in 2019. Holding antivirals for now.  No sign of fungal infection to suggest antifungal at this time.     Reviewed expectations regarding course of current medical issues.  All questions asked were answered.  Outlined signs and symptoms indicating need for more acute intervention. Patient verbalized understanding. After Visit Summary given.   Final Clinical Impressions(s) / UC Diagnoses   Final diagnoses:  Rash     Discharge Instructions      Stop by the pharmacy to pick up your prescriptions.  Follow up with your primary care provider or return to urgent care if not improving.        ED Prescriptions     Medication Sig Dispense Auth. Provider   doxycycline (VIBRAMYCIN) 100 MG capsule Take 1 capsule (100 mg total) by mouth 2 (two) times daily. 20 capsule Sharmarke Cicio, DO   triamcinolone ointment (KENALOG) 0.1 % Apply 1 Application topically 2 (two) times daily. 30 g Katha Cabal, DO      PDMP not reviewed this encounter.              Katha Cabal, DO 03/17/23 1051
